# Patient Record
Sex: Male | Born: 1965 | Race: White | Hispanic: No | Marital: Single | Smoking: Current every day smoker
Health system: Southern US, Community
[De-identification: ages and names within clinical notes are randomized; demographics above are authoritative.]

---

## 2010-12-31 ENCOUNTER — Emergency Department (HOSPITAL_COMMUNITY)
Admission: EM | Admit: 2010-12-31 | Discharge: 2011-01-01 | Disposition: A | Discharge status: Home / Self Care | Payer: Self-pay | Attending: Emergency Medicine | Admitting: Emergency Medicine

## 2010-12-31 ENCOUNTER — Emergency Department (HOSPITAL_COMMUNITY): Payer: Self-pay

## 2010-12-31 DIAGNOSIS — S61209A Unspecified open wound of unspecified finger without damage to nail, initial encounter: Secondary | ICD-10-CM | POA: Insufficient documentation

## 2010-12-31 DIAGNOSIS — W3189XA Contact with other specified machinery, initial encounter: Secondary | ICD-10-CM | POA: Insufficient documentation

## 2010-12-31 LAB — DIFFERENTIAL
Basophils Absolute: 0 10*3/uL (ref 0.0–0.1)
Basophils Relative: 0 % (ref 0–1)
Lymphocytes Relative: 18 % (ref 12–46)
Monocytes Absolute: 0.8 10*3/uL (ref 0.1–1.0)
Neutro Abs: 6.7 10*3/uL (ref 1.7–7.7)
Neutrophils Relative %: 71 % (ref 43–77)

## 2010-12-31 LAB — CBC
HCT: 49.5 % (ref 39.0–52.0)
Hemoglobin: 17.7 g/dL — ABNORMAL HIGH (ref 13.0–17.0)
RBC: 5.43 MIL/uL (ref 4.22–5.81)

## 2011-01-03 NOTE — Op Note (Signed)
NAMEJEX, STRAUSBAUGH               ACCOUNT NO.:  [id number]  MEDICAL RECORD NO.:  [id number]  LOCATION:  [facility]                         FACILITY:  [facility]  PHYSICIAN:  Johnette Abraham, MD    DATE OF BIRTH:  November 30, 1965  DATE OF PROCEDURE:  12/31/2010 DATE OF DISCHARGE:  01/01/2011                              OPERATIVE REPORT   PREOPERATIVE DIAGNOSIS:  Chain saw injury and laceration to the left fourth and fifth digits.  POSTOPERATIVE DIAGNOSIS:  Chain saw injury and laceration to the left fourth and fifth digits.  PROCEDURE: 1. Exploration of wound of the left ring finger and left small finger. 2. Repair of extensor tendon central slip of the left ring finger and     closure of wounds of the left small finger and left ring finger.  INDICATIONS:  Mr. Kosmicki is a male who was using a chain saw to cut limbs this afternoon.  The chainsaw kicked backwards with the blade hitting his left ring and small finger.  He presented to the [facility].  He was evaluated and they felt that this was out of their expertise.  I was consulted and the patient was transferred to [facility] for definitive repair.  On evaluation, he had a gaping wounds overlying the PIP and proximal phalanx of the left ring finger and overlying the middle and distal phalanx and DIP of the left small finger.  He had inability to extend the ring finger with obvious extensor tendon laceration visible throughout the wound.  There was some skin loss on the laceration to the small finger.  Risks, benefits, and alternatives of surgery were discussed with the patient in detail.  He agreed with these risks and agreed to proceed with surgery for exploration and repair.  Consent was obtained.  PROCEDURE:  The patient was taken to the operating room and placed supine on the operating room table.  Preoperative antibiotics were given.  A time-out was performed.  The left upper extremity was prepped and draped in normal  sterile fashion.  The arm was elevated and exsanguinated.  The tourniquet was inflated to 250 mmHg.  The small finger was evaluated first.  Nonviable skin, full-thickness skin, subcutaneous tissue were sharply debrided and excised, exposing the extensor tendon apparatus.  There was a small nick of the extensor tendon; however, the majority of it was intact.  The skin was gently undermined to provide a tension-free closure and the lacerations were closed with multiple interrupted 5-0 nylon sutures.  Laceration closure approximate 2.5 cm.  Next, the ring finger was addressed.  The wound was enlarged and thoroughly irrigated.  The wound extended actually into the joint capsule of the PIP joint.  There was disruption of the extensor tendon and central slip mostly on the ulnar aspect of the finger.  There were small pieces of bone that were also in the wound.  This was thoroughly irrigated out.  Full-thickness skin, subcutaneous tissue, bone, and tendon were debrided.  The joint was irrigated as well. Following the tendon was brought into close approximation while the finger was held in extension with a figure-of-eight 4-0 FiberWire sutures given nice repair.  Afterwards, the skin  edges were closed with multiple interrupted 5-0 nylon sutures.  Wound closure here was approximately 2 cm.  Afterwards, the tourniquet was released.  The fingers returned to pink color.  Xeroform, a sterile dressing, and a ulnar gutter splint with the fingers and extension was applied.  The patient tolerated the procedure well and was taken to recovery room in stable condition.     Johnette Abraham, MD     HCC/MEDQ  D:  01/01/2011  T:  01/02/2011  Job:  [id number]  Electronically Signed by Knute Neu MD on 01/03/2011 11:23:57 AM

## 2012-01-12 ENCOUNTER — Encounter (HOSPITAL_COMMUNITY): Payer: Self-pay | Admitting: *Deleted

## 2012-01-12 ENCOUNTER — Emergency Department (HOSPITAL_COMMUNITY): Payer: Self-pay

## 2012-01-12 ENCOUNTER — Emergency Department (HOSPITAL_COMMUNITY)
Admission: EM | Admit: 2012-01-12 | Discharge: 2012-01-13 | Disposition: A | Discharge status: Home / Self Care | Payer: Self-pay | Attending: Emergency Medicine | Admitting: Emergency Medicine

## 2012-01-12 DIAGNOSIS — F17200 Nicotine dependence, unspecified, uncomplicated: Secondary | ICD-10-CM | POA: Insufficient documentation

## 2012-01-12 DIAGNOSIS — S93402A Sprain of unspecified ligament of left ankle, initial encounter: Secondary | ICD-10-CM

## 2012-01-12 DIAGNOSIS — S8392XA Sprain of unspecified site of left knee, initial encounter: Secondary | ICD-10-CM

## 2012-01-12 DIAGNOSIS — S93499A Sprain of other ligament of unspecified ankle, initial encounter: Secondary | ICD-10-CM | POA: Insufficient documentation

## 2012-01-12 DIAGNOSIS — W010XXA Fall on same level from slipping, tripping and stumbling without subsequent striking against object, initial encounter: Secondary | ICD-10-CM | POA: Insufficient documentation

## 2012-01-12 DIAGNOSIS — IMO0002 Reserved for concepts with insufficient information to code with codable children: Secondary | ICD-10-CM | POA: Insufficient documentation

## 2012-01-12 MED ORDER — OXYCODONE-ACETAMINOPHEN 5-325 MG PO TABS
2.0000 | ORAL_TABLET | Freq: Once | ORAL | Status: AC
  Administered 2012-01-12: 2 via ORAL
  Filled 2012-01-12: qty 2

## 2012-01-12 MED ORDER — IBUPROFEN 400 MG PO TABS
800.0000 mg | ORAL_TABLET | Freq: Once | ORAL | Status: AC
  Administered 2012-01-12: 800 mg via ORAL
  Filled 2012-01-12: qty 2

## 2012-01-12 NOTE — ED Notes (Addendum)
Patient injured his left knee and foot today and the pool.  He twisted his left leg from the knee down and the pain is getting worse.

## 2012-01-13 MED ORDER — HYDROCODONE-ACETAMINOPHEN 5-500 MG PO TABS: 1.0000 | ORAL_TABLET | Freq: Four times a day (QID) | ORAL | Status: AC | PRN

## 2012-01-13 MED ORDER — IBUPROFEN 800 MG PO TABS: 800.0000 mg | ORAL_TABLET | Freq: Three times a day (TID) | ORAL | Status: AC

## 2012-01-13 NOTE — ED Notes (Signed)
Pt discharged home with friend.

## 2012-01-13 NOTE — ED Provider Notes (Signed)
History     CSN: [id number]  Arrival date & time 01/12/12  2129   First MD Initiated Contact with Patient 01/12/12 2258      Chief Complaint  Patient presents with  . Knee Injury    (Consider location/radiation/quality/duration/timing/severity/associated sxs/prior treatment) HPI  Patient presents to ER complaining of left foot/ankle and knee injury a few hours PTA stating he was carrying a heavy bucket of chemical around the side of a pool and slipped causing his left knee, ankle and foot to twist stating "my leg stayed in one place and the rest of my body twisted the other." Patient is complaining of greatest pain in foot and ankle complaining of associated bruising and swelling. Mild pain in left knee. Denies swelling of left knee. Took nothing for pain PTA. States he is unable to bear weight due to pain. Patient has seen Dr. Priscille Kluver with ortho in past for other ortho complaints. Denies extremity numbness or tingling. Denies back pain or neck pain. Pain aggravated by movement and weight bearing. Improved with lying still.    History reviewed. No pertinent past medical history.  No past surgical history on file.  History reviewed. No pertinent family history.  History  Substance Use Topics  . Smoking status: Current Everyday Smoker    Types: Cigarettes  . Smokeless tobacco: Not on file  . Alcohol Use: Yes      Review of Systems  All other systems reviewed and are negative.    Allergies  Review of patient's allergies indicates no known allergies.  Home Medications  No current outpatient prescriptions on file.  BP 116/67  Pulse 84  Temp 97.6 F (36.4 C) (Oral)  Resp 16  SpO2 96%  Physical Exam  Nursing note and vitals reviewed. Constitutional: He is oriented to person, place, and time. He appears well-developed.  HENT:  Head: Normocephalic and atraumatic.  Eyes: Conjunctivae are normal.  Neck: Normal range of motion. Neck supple.  Cardiovascular: Normal  rate.   Pulmonary/Chest: Effort normal.  Musculoskeletal: He exhibits edema and tenderness.       Mild TTP of entire knee but FROM without laxity of ant/post/med/lateral stress. Negative bollotment. mild Pain with FROM but no crepitous. No heat or erythema of knee. FROM of bilateral hips without pain.   Swelling and TTP of left lateral ankle with TTP and swelling into fore foot but no TTP of calf. No break in skin. Good pedal pulse and cap refill of all toes. Wiggling toes without difficulty.   Neurological: He is alert and oriented to person, place, and time.  Skin: Skin is warm and dry. No rash noted. No erythema. No pallor.    ED Course  Procedures (including critical care time)  PO Percocet and ibuprofen  Labs Reviewed - No data to display Dg Ankle Complete Left  01/13/2012  *RADIOLOGY REPORT*  Clinical Data: Left ankle pain after fall.  LEFT ANKLE COMPLETE - 3+ VIEW  Comparison: None.  Findings: No fracture or dislocation is noted.  Joint spaces are intact.  IMPRESSION: Normal left ankle.  Original Report Authenticated By: Venita Sheffield., M.D.   Dg Foot Complete Left  01/13/2012  *RADIOLOGY REPORT*  Clinical Data: Left foot pain after fall.  LEFT FOOT - COMPLETE 3+ VIEW  Comparison: None.  Findings: No fracture or dislocation is noted.  Joint spaces are intact.  Small bone spur is seen arising from posterior calcaneus.  IMPRESSION: No acute abnormality involving left foot.  Original Report Authenticated By:  Venita Sheffield., M.D.     1. Sprain of left ankle   2. Sprain of left knee       MDM  No acute findings on xray with LLE neuro vasc intact. Likely sprain of left foot/ankle and knee. Patient has established relationship with ortho to follow up for recheck of ongoing pain. Will brace and give crutches for comfort.         [place], [place] 01/13/12 [phone]

## 2012-01-13 NOTE — ED Provider Notes (Signed)
Medical screening examination/treatment/procedure(s) were performed by non-physician practitioner and as supervising physician I was immediately available for consultation/collaboration.   Carleene Cooper III, MD 01/13/12 [phone]

## 2012-01-13 NOTE — ED Notes (Signed)
Ortho paged. 

## 2013-08-11 IMAGING — CR DG FOOT COMPLETE 3+V*L*
3 series · 3 of 3 positions shown · non-contrast
Comparison: None.

CLINICAL DATA: Left foot pain after fall.

LEFT FOOT - COMPLETE 3+ VIEW

[x foot lat left]
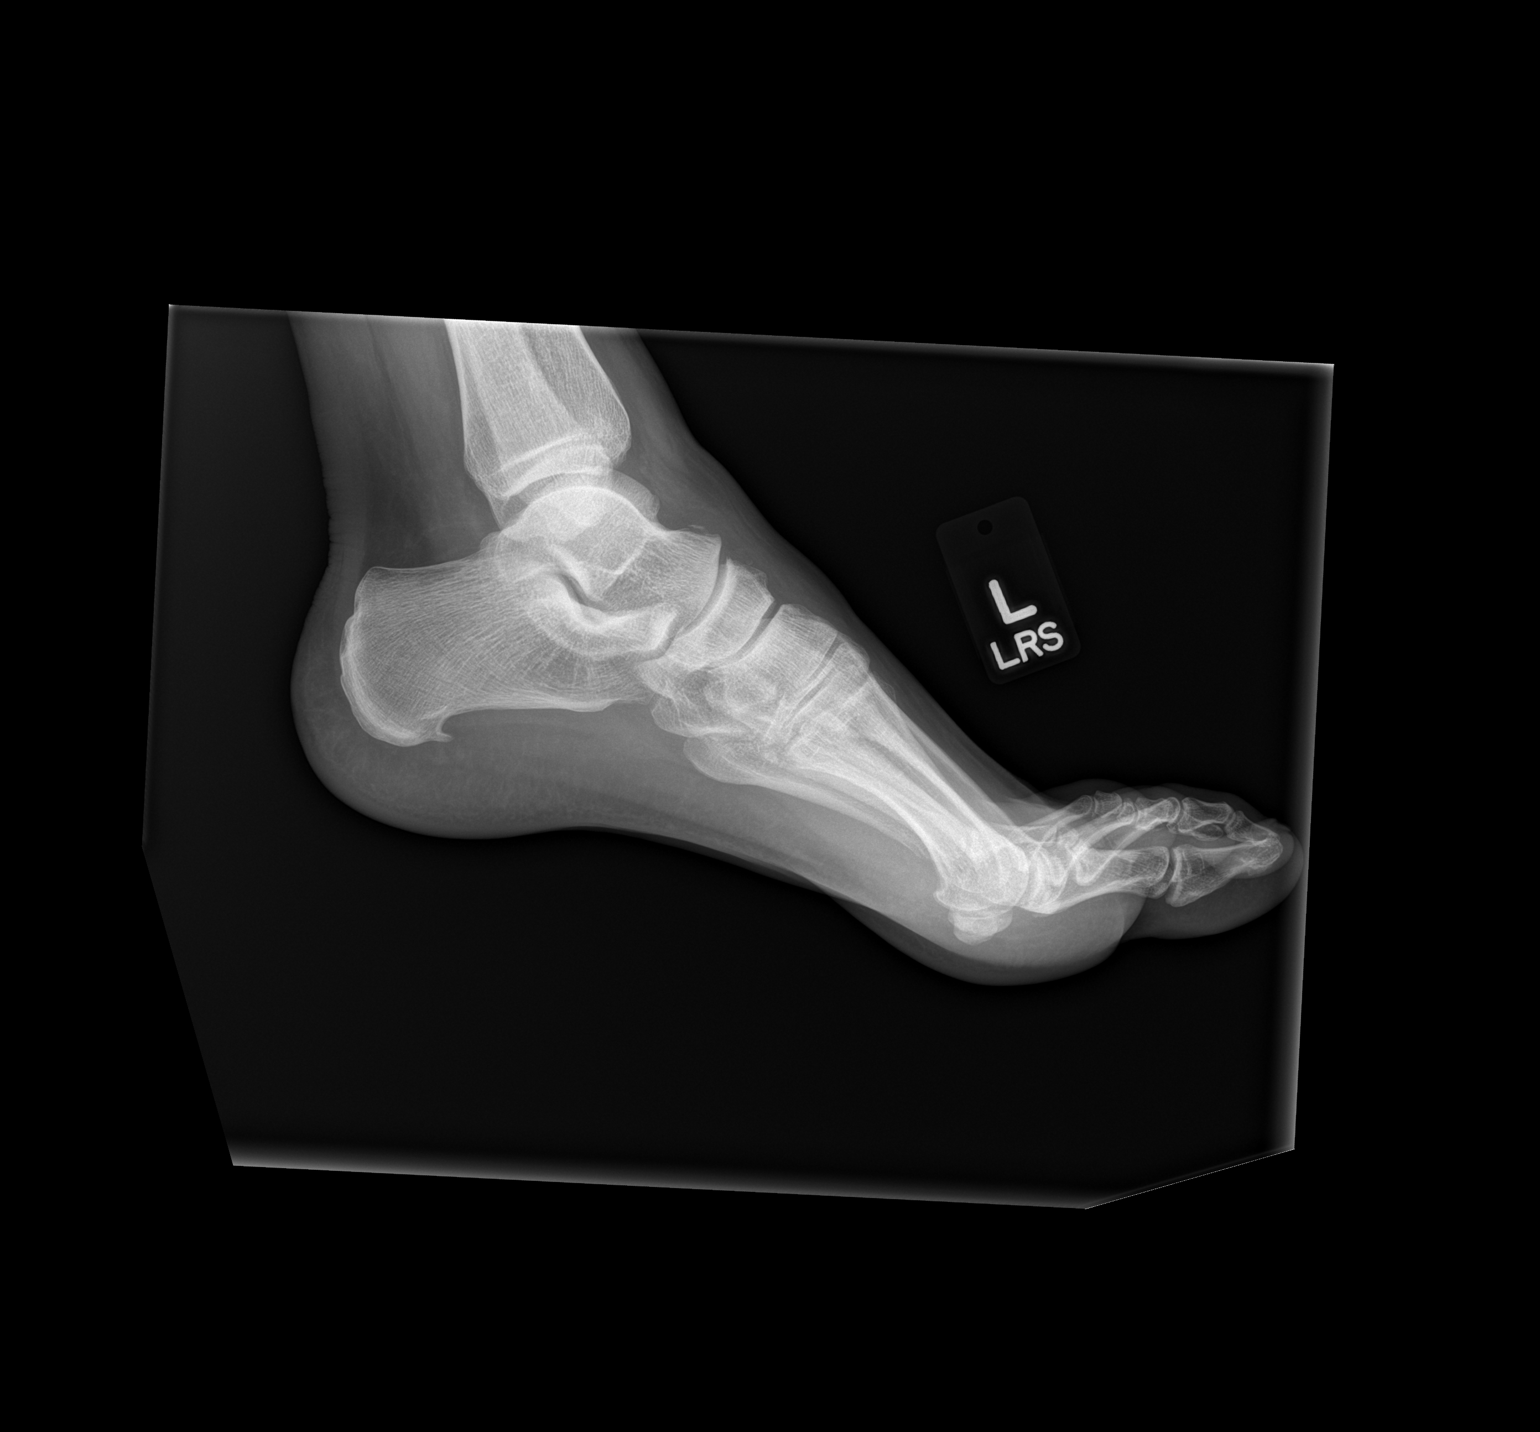

[x foot ap left]
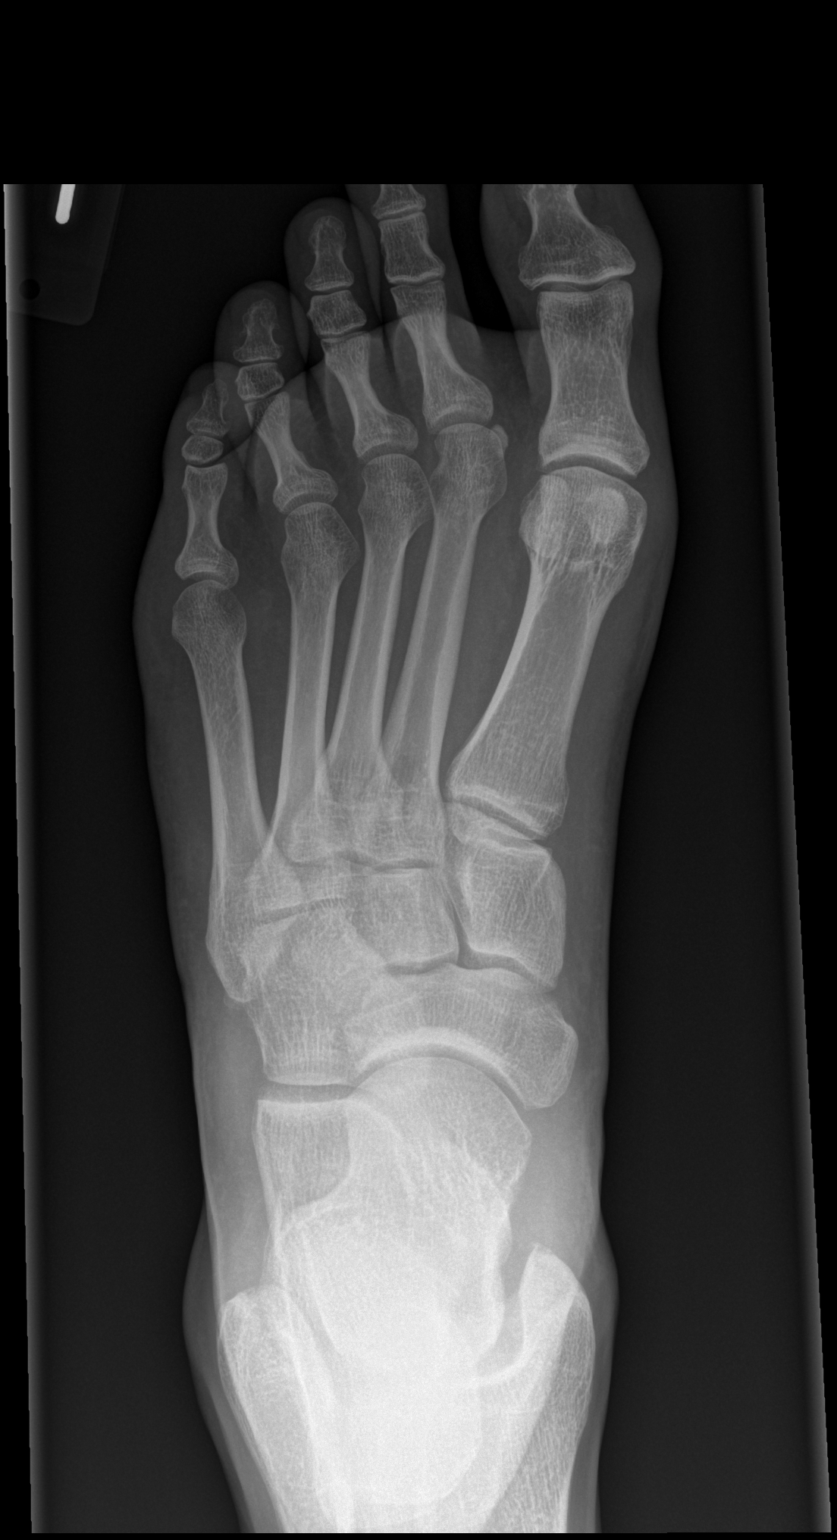

[x foot obl left]
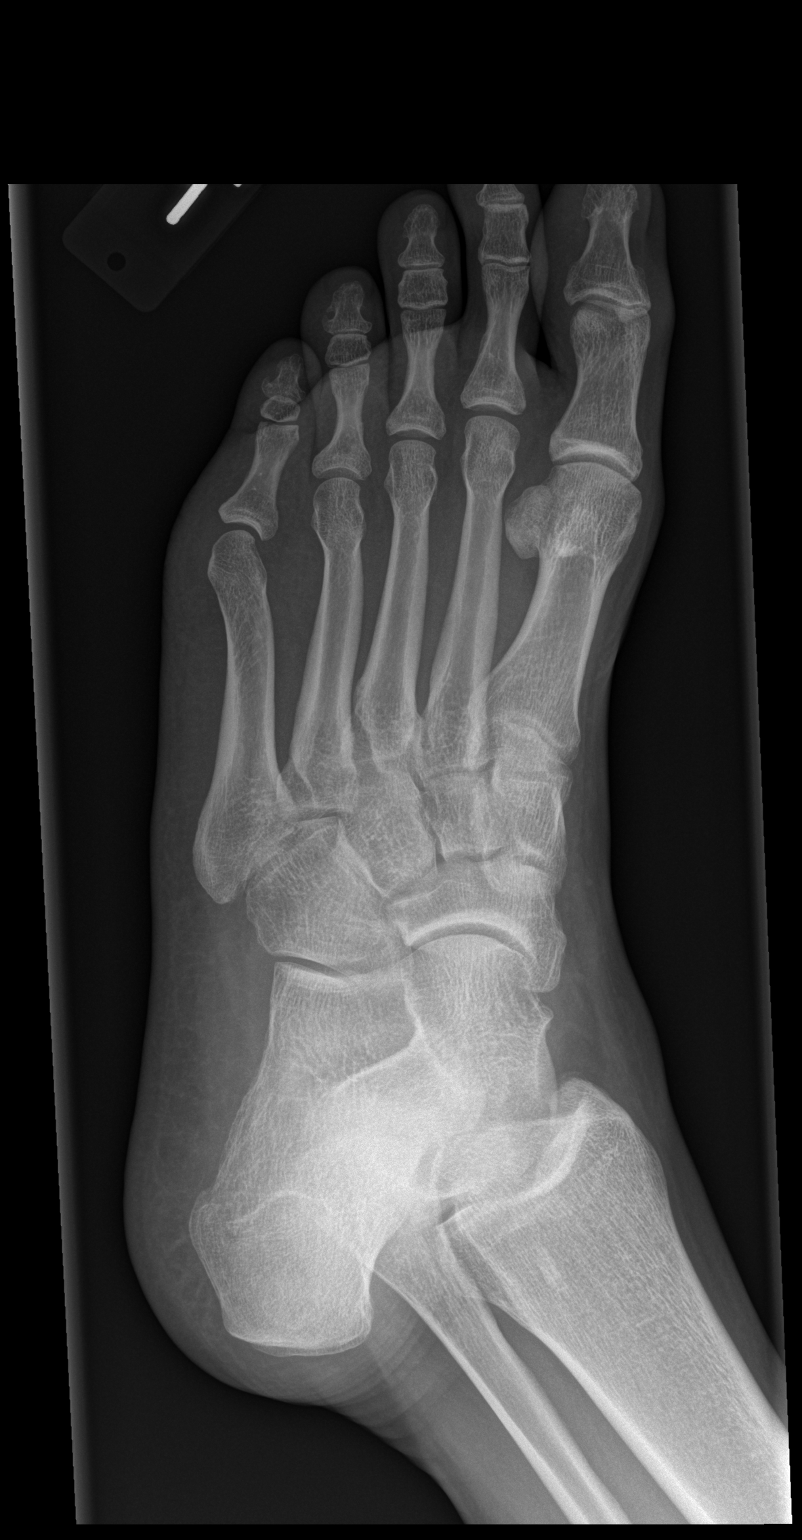

[3 of 3 positions shown; findings below may reference images not displayed]

FINDINGS: No fracture or dislocation is noted.  Joint spaces are
intact.  Small bone spur is seen arising from posterior calcaneus.
IMPRESSION: No acute abnormality involving left foot.

## 2013-08-11 IMAGING — CR DG ANKLE COMPLETE 3+V*L*
3 series · 3 of 3 positions shown · non-contrast
Comparison: None.

CLINICAL DATA: Left ankle pain after fall.

LEFT ANKLE COMPLETE - 3+ VIEW

[x ankle ap left]
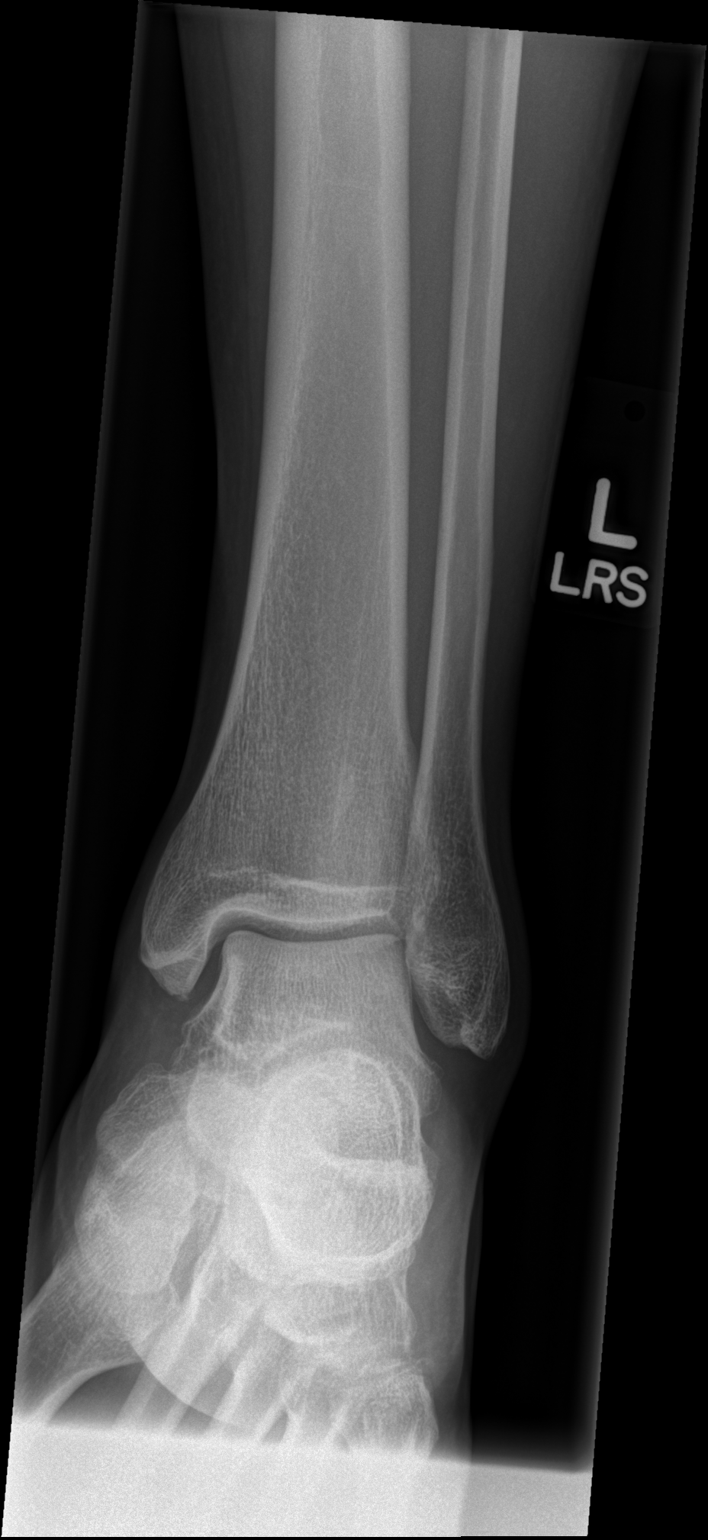

[x ankle obl left]
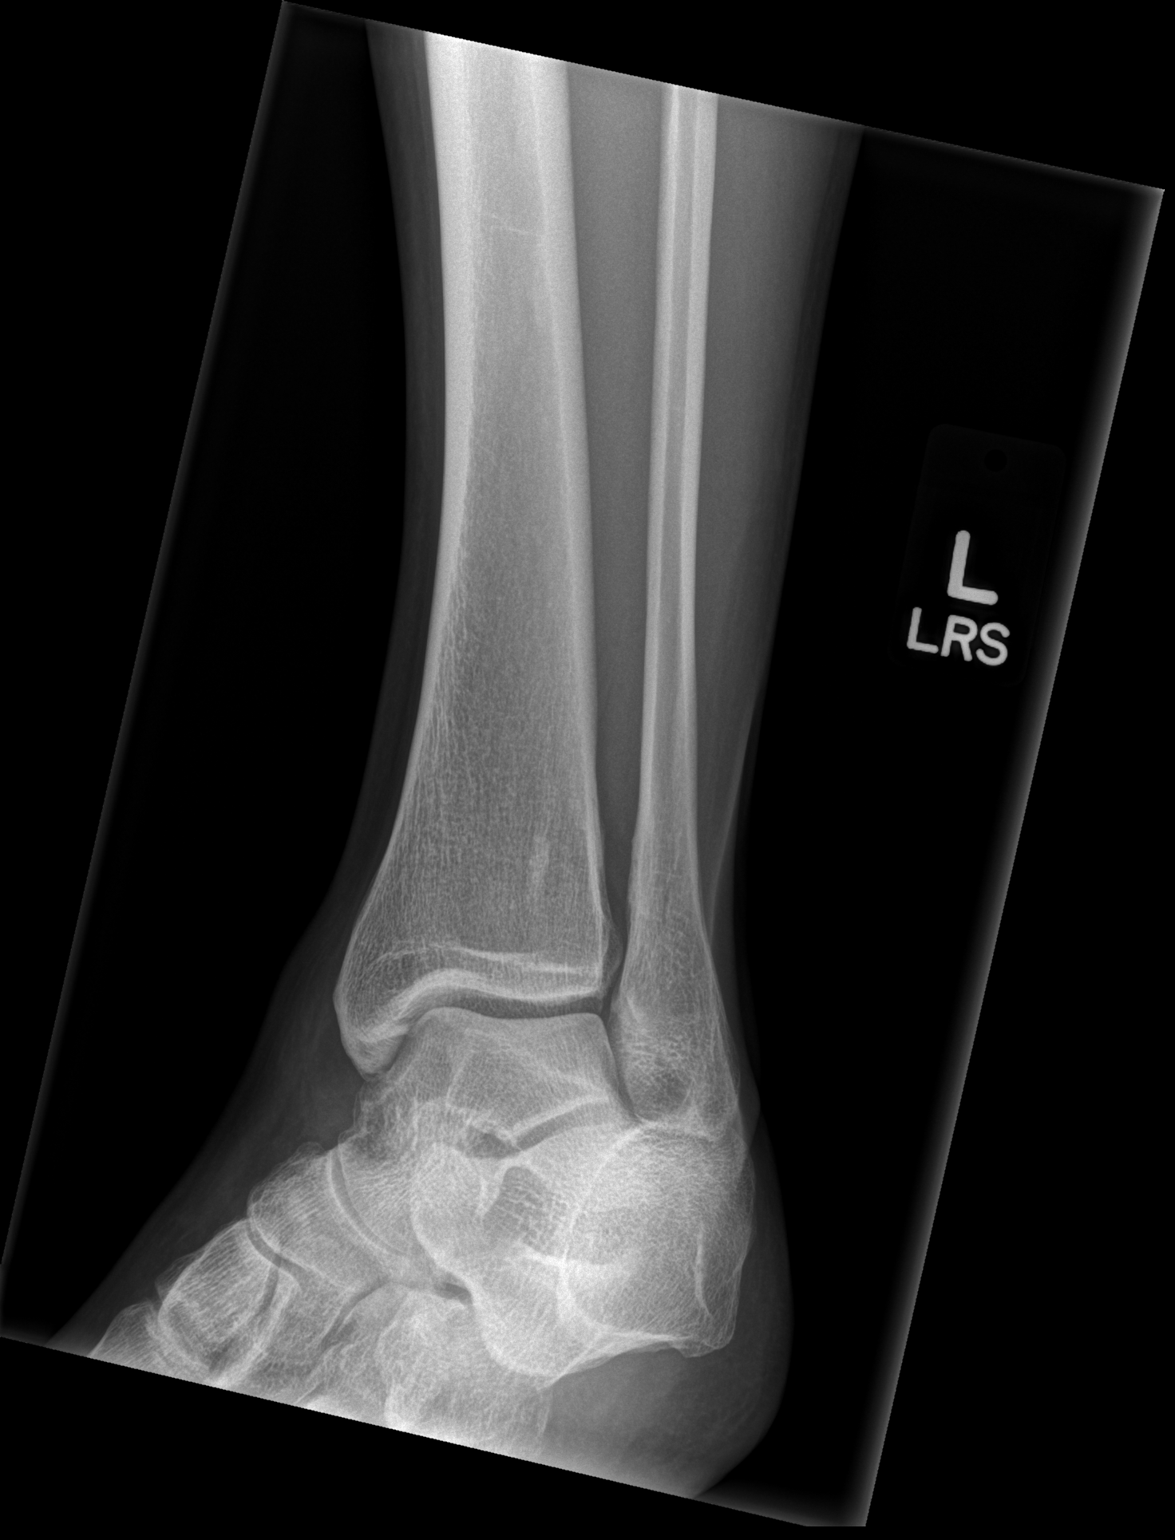

[x ankle lat left]
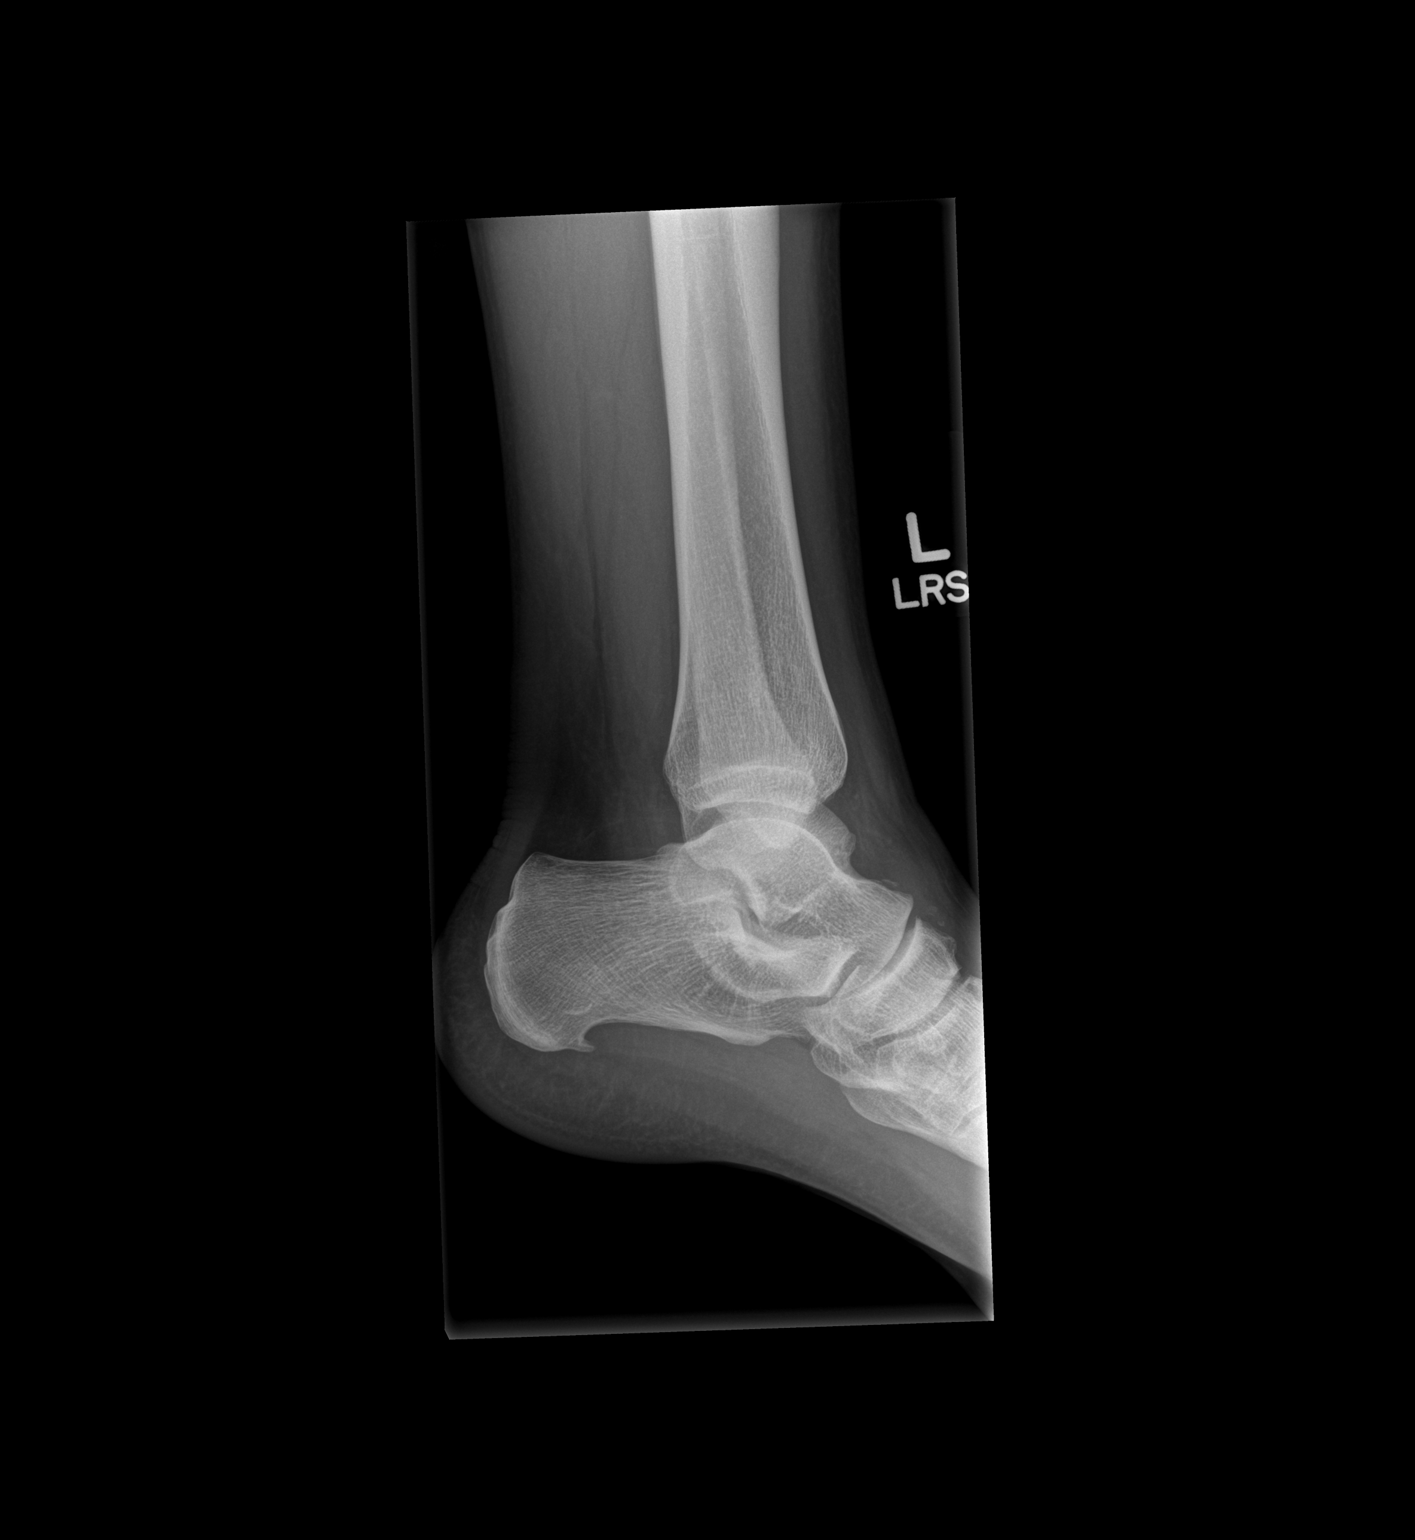

[3 of 3 positions shown; findings below may reference images not displayed]

FINDINGS: No fracture or dislocation is noted.  Joint spaces are
intact.
IMPRESSION: Normal left ankle.

## 2013-10-28 ENCOUNTER — Emergency Department (HOSPITAL_COMMUNITY)
Admission: EM | Admit: 2013-10-28 | Discharge: 2013-10-28 | Disposition: A | Discharge status: Home / Self Care | Payer: No Typology Code available for payment source | Source: Home / Self Care | Attending: Family Medicine | Admitting: Family Medicine

## 2013-10-28 ENCOUNTER — Encounter (HOSPITAL_COMMUNITY): Payer: Self-pay | Admitting: Emergency Medicine

## 2013-10-28 DIAGNOSIS — J302 Other seasonal allergic rhinitis: Secondary | ICD-10-CM

## 2013-10-28 DIAGNOSIS — J309 Allergic rhinitis, unspecified: Secondary | ICD-10-CM

## 2013-10-28 LAB — POCT RAPID STREP A: Streptococcus, Group A Screen (Direct): NEGATIVE

## 2013-10-28 MED ORDER — IPRATROPIUM BROMIDE 0.06 % NA SOLN: 2.0000 | Freq: Four times a day (QID) | NASAL | Status: DC

## 2013-10-28 MED ORDER — CETIRIZINE HCL 10 MG PO TABS: 10.0000 mg | ORAL_TABLET | Freq: Every day | ORAL | Status: DC

## 2013-10-28 NOTE — ED Provider Notes (Signed)
CSN: [id number]     Arrival date & time 10/28/13  1212 History   First MD Initiated Contact with Patient 10/28/13 1240     Chief Complaint  Patient presents with  . Sore Throat   (Consider location/radiation/quality/duration/timing/severity/associated sxs/prior Treatment) Patient is a 48 y.o. male presenting with pharyngitis. The history is provided by the patient.  Sore Throat This is a new problem. The current episode started more than 1 week ago (2 weeks of sx of st, today girlfriend dx'd with strep.). The problem has been gradually worsening.    History reviewed. No pertinent past medical history. History reviewed. No pertinent past surgical history. No family history on file. History  Substance Use Topics  . Smoking status: Current Every Day Smoker    Types: Cigarettes  . Smokeless tobacco: Not on file  . Alcohol Use: Yes    Review of Systems  Constitutional: Negative.  Negative for fever.  HENT: Positive for congestion, rhinorrhea and sore throat.   Respiratory: Positive for cough.   Hematological: Positive for adenopathy.    Allergies  Review of patient's allergies indicates no known allergies.  Home Medications   Prior to Admission medications   Not on File   BP 156/83  Pulse 89  Temp(Src) 97.2 F (36.2 C) (Oral)  Resp 18  SpO2 97% Physical Exam  Nursing note and vitals reviewed. Constitutional: He is oriented to person, place, and time. He appears well-developed and well-nourished.  HENT:  Head: Normocephalic.  Right Ear: External ear normal.  Left Ear: External ear normal.  Mouth/Throat: Uvula is midline and mucous membranes are normal. Posterior oropharyngeal erythema present. No oropharyngeal exudate or tonsillar abscesses.  Neck: Normal range of motion. Neck supple.  Lymphadenopathy:    He has cervical adenopathy.  Neurological: He is alert and oriented to person, place, and time.  Skin: Skin is warm and dry.    ED Course  Procedures  (including critical care time) Labs Review Labs Reviewed  POCT RAPID STREP A ([facility] ONLY)   Strep neg. Imaging Review No results found.   MDM   1. Seasonal allergic rhinitis        Linna HoffJames D Edsel Shives, MD 10/28/13 1426

## 2013-10-28 NOTE — Discharge Instructions (Signed)
Drink plenty of fluids as discussed, use medicine as prescribed, and mucinex or delsym for cough. Return or see your doctor if further problems °

## 2013-10-28 NOTE — ED Notes (Signed)
Sore throat for 2 weeks, reports girlfriend diagnosed with strep this morning

## 2013-10-30 LAB — CULTURE, GROUP A STREP

## 2021-05-24 ENCOUNTER — Inpatient Hospital Stay (HOSPITAL_COMMUNITY)
Admission: EM | Admit: 2021-05-24 | Discharge: 2021-05-26 | DRG: 639 | Disposition: A | Discharge status: Home / Self Care | Payer: No Typology Code available for payment source | Attending: Internal Medicine | Admitting: Internal Medicine

## 2021-05-24 ENCOUNTER — Other Ambulatory Visit: Payer: Self-pay

## 2021-05-24 ENCOUNTER — Encounter (HOSPITAL_COMMUNITY): Payer: Self-pay | Admitting: Emergency Medicine

## 2021-05-24 ENCOUNTER — Emergency Department (HOSPITAL_COMMUNITY): Payer: No Typology Code available for payment source

## 2021-05-24 DIAGNOSIS — Z20822 Contact with and (suspected) exposure to covid-19: Secondary | ICD-10-CM | POA: Diagnosis present

## 2021-05-24 DIAGNOSIS — Z720 Tobacco use: Secondary | ICD-10-CM | POA: Diagnosis present

## 2021-05-24 DIAGNOSIS — F1090 Alcohol use, unspecified, uncomplicated: Secondary | ICD-10-CM | POA: Diagnosis present

## 2021-05-24 DIAGNOSIS — Z8249 Family history of ischemic heart disease and other diseases of the circulatory system: Secondary | ICD-10-CM

## 2021-05-24 DIAGNOSIS — Z597 Insufficient social insurance and welfare support: Secondary | ICD-10-CM

## 2021-05-24 DIAGNOSIS — Z789 Other specified health status: Secondary | ICD-10-CM | POA: Diagnosis present

## 2021-05-24 DIAGNOSIS — Z833 Family history of diabetes mellitus: Secondary | ICD-10-CM

## 2021-05-24 DIAGNOSIS — E876 Hypokalemia: Secondary | ICD-10-CM | POA: Diagnosis present

## 2021-05-24 DIAGNOSIS — E1110 Type 2 diabetes mellitus with ketoacidosis without coma: Principal | ICD-10-CM

## 2021-05-24 DIAGNOSIS — F17210 Nicotine dependence, cigarettes, uncomplicated: Secondary | ICD-10-CM | POA: Diagnosis present

## 2021-05-24 LAB — CBC WITH DIFFERENTIAL/PLATELET
Abs Immature Granulocytes: 0.05 10*3/uL (ref 0.00–0.07)
Basophils Absolute: 0 10*3/uL (ref 0.0–0.1)
Basophils Relative: 0 %
Eosinophils Absolute: 0 10*3/uL (ref 0.0–0.5)
Eosinophils Relative: 0 %
HCT: 49.6 % (ref 39.0–52.0)
Hemoglobin: 17 g/dL (ref 13.0–17.0)
Immature Granulocytes: 1 %
Lymphocytes Relative: 11 %
Lymphs Abs: 1.2 10*3/uL (ref 0.7–4.0)
MCH: 32.1 pg (ref 26.0–34.0)
MCHC: 34.3 g/dL (ref 30.0–36.0)
MCV: 93.6 fL (ref 80.0–100.0)
Monocytes Absolute: 0.6 10*3/uL (ref 0.1–1.0)
Monocytes Relative: 6 %
Neutro Abs: 9.1 10*3/uL — ABNORMAL HIGH (ref 1.7–7.7)
Neutrophils Relative %: 82 %
Platelets: 242 10*3/uL (ref 150–400)
RBC: 5.3 MIL/uL (ref 4.22–5.81)
RDW: 12.2 % (ref 11.5–15.5)
WBC: 11 10*3/uL — ABNORMAL HIGH (ref 4.0–10.5)
nRBC: 0 % (ref 0.0–0.2)

## 2021-05-24 LAB — BLOOD GAS, VENOUS
Acid-base deficit: 8.4 mmol/L — ABNORMAL HIGH (ref 0.0–2.0)
Bicarbonate: 17.4 mmol/L — ABNORMAL LOW (ref 20.0–28.0)
O2 Saturation: 95 %
Patient temperature: 98.6
pCO2, Ven: 37.6 mmHg — ABNORMAL LOW (ref 44.0–60.0)
pH, Ven: 7.286 (ref 7.250–7.430)
pO2, Ven: 84.4 mmHg — ABNORMAL HIGH (ref 32.0–45.0)

## 2021-05-24 LAB — URINALYSIS, ROUTINE W REFLEX MICROSCOPIC
Bilirubin Urine: NEGATIVE
Glucose, UA: >=500 mg/dL — AB
Hgb urine dipstick: NEGATIVE
Ketones, ur: 15 mg/dL — AB
Leukocytes,Ua: NEGATIVE
Nitrite: NEGATIVE
Protein, ur: NEGATIVE mg/dL
Specific Gravity, Urine: <1.005 — ABNORMAL LOW (ref 1.005–1.030)
pH: 5.5 (ref 5.0–8.0)

## 2021-05-24 LAB — I-STAT CHEM 8, ED
BUN: 17 mg/dL (ref 6–20)
Calcium, Ion: 1.07 mmol/L — ABNORMAL LOW (ref 1.15–1.40)
Chloride: 96 mmol/L — ABNORMAL LOW (ref 98–111)
Creatinine, Ser: 1.3 mg/dL — ABNORMAL HIGH (ref 0.61–1.24)
Glucose, Bld: >700 mg/dL (ref 70–99)
HCT: 52 % (ref 39.0–52.0)
Hemoglobin: 17.7 g/dL — ABNORMAL HIGH (ref 13.0–17.0)
Potassium: 5 mmol/L (ref 3.5–5.1)
Sodium: 130 mmol/L — ABNORMAL LOW (ref 135–145)
TCO2: 20 mmol/L — ABNORMAL LOW (ref 22–32)

## 2021-05-24 LAB — CBG MONITORING, ED
Glucose-Capillary: >600 mg/dL (ref 70–99)
Glucose-Capillary: 597 mg/dL (ref 70–99)
Glucose-Capillary: 457 mg/dL — ABNORMAL HIGH (ref 70–99)
Glucose-Capillary: 275 mg/dL — ABNORMAL HIGH (ref 70–99)
Glucose-Capillary: 207 mg/dL — ABNORMAL HIGH (ref 70–99)

## 2021-05-24 LAB — RAPID URINE DRUG SCREEN, HOSP PERFORMED
Amphetamines: NOT DETECTED
Barbiturates: NOT DETECTED
Benzodiazepines: NOT DETECTED
Cocaine: NOT DETECTED
Opiates: NOT DETECTED
Tetrahydrocannabinol: NOT DETECTED

## 2021-05-24 LAB — COMPREHENSIVE METABOLIC PANEL
ALT: 47 U/L — ABNORMAL HIGH (ref 0–44)
AST: 30 U/L (ref 15–41)
Albumin: 4.5 g/dL (ref 3.5–5.0)
Alkaline Phosphatase: 91 U/L (ref 38–126)
Anion gap: 19 — ABNORMAL HIGH (ref 5–15)
BUN: 16 mg/dL (ref 6–20)
CO2: 15 mmol/L — ABNORMAL LOW (ref 22–32)
Calcium: 8.9 mg/dL (ref 8.9–10.3)
Chloride: 93 mmol/L — ABNORMAL LOW (ref 98–111)
Creatinine, Ser: 1.19 mg/dL (ref 0.61–1.24)
GFR, Estimated: >60 mL/min (ref >60)
Glucose, Bld: 869 mg/dL (ref 70–99)
Potassium: 5.1 mmol/L (ref 3.5–5.1)
Sodium: 127 mmol/L — ABNORMAL LOW (ref 135–145)
Total Bilirubin: 1.3 mg/dL — ABNORMAL HIGH (ref 0.3–1.2)
Total Protein: 7.7 g/dL (ref 6.5–8.1)

## 2021-05-24 LAB — BETA-HYDROXYBUTYRIC ACID: Beta-Hydroxybutyric Acid: 1.35 mmol/L — ABNORMAL HIGH (ref 0.05–0.27)

## 2021-05-24 LAB — ETHANOL: Alcohol, Ethyl (B): 108 mg/dL — ABNORMAL HIGH (ref <10)

## 2021-05-24 LAB — RESP PANEL BY RT-PCR (FLU A&B, COVID) ARPGX2
Influenza A by PCR: NEGATIVE
Influenza B by PCR: NEGATIVE
SARS Coronavirus 2 by RT PCR: NEGATIVE

## 2021-05-24 LAB — URINALYSIS, MICROSCOPIC (REFLEX)
Bacteria, UA: NONE SEEN
RBC / HPF: NONE SEEN RBC/hpf (ref 0–5)

## 2021-05-24 MED ORDER — INSULIN REGULAR(HUMAN) IN NACL 100-0.9 UT/100ML-% IV SOLN
INTRAVENOUS | Status: DC
  Administered 2021-05-24: 15 [IU]/h via INTRAVENOUS
  Filled 2021-05-24: qty 100

## 2021-05-24 MED ORDER — DEXTROSE 50 % IV SOLN: 0.0000 mL | INTRAVENOUS | Status: DC | PRN

## 2021-05-24 MED ORDER — LORAZEPAM 2 MG/ML IJ SOLN
1.0000 mg | INTRAMUSCULAR | Status: DC | PRN
  Administered 2021-05-24 – 2021-05-25 (×2): 1 mg via INTRAVENOUS
  Administered 2021-05-25: 2 mg via INTRAVENOUS
  Filled 2021-05-24 (×3): qty 1

## 2021-05-24 MED ORDER — LORAZEPAM 1 MG PO TABS: 1.0000 mg | ORAL_TABLET | ORAL | Status: DC | PRN

## 2021-05-24 MED ORDER — THIAMINE HCL 100 MG PO TABS
100.0000 mg | ORAL_TABLET | Freq: Every day | ORAL | Status: DC
  Administered 2021-05-25 – 2021-05-26 (×2): 100 mg via ORAL
  Filled 2021-05-24 (×2): qty 1

## 2021-05-24 MED ORDER — THIAMINE HCL 100 MG/ML IJ SOLN
100.0000 mg | Freq: Every day | INTRAMUSCULAR | Status: DC
  Administered 2021-05-24: 100 mg via INTRAVENOUS
  Filled 2021-05-24: qty 2

## 2021-05-24 MED ORDER — NICOTINE 21 MG/24HR TD PT24
21.0000 mg | MEDICATED_PATCH | Freq: Every day | TRANSDERMAL | Status: DC
  Administered 2021-05-24 – 2021-05-26 (×3): 21 mg via TRANSDERMAL
  Filled 2021-05-24 (×3): qty 1

## 2021-05-24 MED ORDER — LACTATED RINGERS IV SOLN: INTRAVENOUS | Status: DC

## 2021-05-24 MED ORDER — INSULIN REGULAR(HUMAN) IN NACL 100-0.9 UT/100ML-% IV SOLN: INTRAVENOUS | Status: DC

## 2021-05-24 MED ORDER — LACTATED RINGERS IV BOLUS
20.0000 mL/kg | Freq: Once | INTRAVENOUS | Status: AC
  Administered 2021-05-24: 2086 mL via INTRAVENOUS

## 2021-05-24 MED ORDER — ACETAMINOPHEN 325 MG PO TABS
650.0000 mg | ORAL_TABLET | Freq: Four times a day (QID) | ORAL | Status: DC | PRN
  Administered 2021-05-24 – 2021-05-25 (×2): 650 mg via ORAL
  Filled 2021-05-24 (×2): qty 2

## 2021-05-24 MED ORDER — DEXTROSE IN LACTATED RINGERS 5 % IV SOLN: INTRAVENOUS | Status: DC

## 2021-05-24 MED ORDER — ENOXAPARIN SODIUM 40 MG/0.4ML IJ SOSY
40.0000 mg | PREFILLED_SYRINGE | INTRAMUSCULAR | Status: DC
  Administered 2021-05-24 – 2021-05-25 (×2): 40 mg via SUBCUTANEOUS
  Filled 2021-05-24 (×2): qty 0.4

## 2021-05-24 MED ORDER — FOLIC ACID 1 MG PO TABS
1.0000 mg | ORAL_TABLET | Freq: Every day | ORAL | Status: DC
  Administered 2021-05-24 – 2021-05-26 (×3): 1 mg via ORAL
  Filled 2021-05-24 (×3): qty 1

## 2021-05-24 MED ORDER — ADULT MULTIVITAMIN W/MINERALS CH
1.0000 | ORAL_TABLET | Freq: Every day | ORAL | Status: DC
  Administered 2021-05-24 – 2021-05-26 (×3): 1 via ORAL
  Filled 2021-05-24 (×3): qty 1

## 2021-05-24 NOTE — ED Triage Notes (Signed)
Patient BIBA from home c/o generalized weakness x1 month, increased thirst and fatigue x1 week. Reports checking blood sugar at home and reading >500. Pt does not report hx of diabetes. Hx heavy alcohol and tobacco use. Pt reports he does not have a PCP.   BP 112/70 HR 80 NSR RR 18 SpO2 98% RA CBG HIGH   20 G LAC  600 cc NS

## 2021-05-24 NOTE — H&P (Signed)
History and Physical    Sakai Wolford PZW:[id number] DOB: 01-27-1966 DOA: 05/24/2021  PCP: Pcp, No  Patient coming from: Home  I have personally briefly reviewed patient's old medical records in [facility]  Chief Complaint: Weakness, increased thirst, elevated blood sugar  HPI: Korben Carcione is a 55 y.o. male with medical history significant for alcohol and tobacco use who presented to the ED for evaluation of increased thirst, fatigue, and elevated blood sugars.  Patient reports developing URI symptoms 3 weeks ago.  Began to feel better however over the last week he has been feeling very fatigued with significant polyuria and polydipsia.  He has been drinking a lot of fluids consisting of fruit juices, sodas, and sports drinks.  He reports some recent burning with urination.  He has also had some loose stools.  He began to feel nauseous last night and had 1 episode of nonbloody nonbilious emesis.  He has had some lower abdominal pain.  He reports continued frequent nonproductive cough which is unchanged from baseline.  He has not had any dyspnea.  He is a chronic smoker of at least 1 pack/day for at least 25 years.  He reports chronic daily alcohol use, usually a sixpack of beer per day.  Also drinks occasional liquor and wine.  He does admit to recent cocaine use, last use few days prior to admission.  He reports occasional marijuana use.  He denies any IV drug use.  He denies any known drug allergies.  He is not aware of any chronic medical conditions.  He denies any prior known history of diabetes.  He states that his sister has diabetes and his mother has hypertension.  He does not have routine medical care and has not seen a doctor for years.  ED Course:  Initial vitals showed BP 103/79, pulse 101, RR 20, temp 98.5 F, SPO2 96% on room air.  Labs show serum glucose 869, sodium 127 (145 when corrected for hyperglycemia), bicarb 15, potassium 5.1, anion gap 19, BUN 16,  creatinine 1.19, beta hydroxybutyrate 1.35, WBC 11.0, hemoglobin 17.0, platelets 242,000.  VBG showed pH 7.286, PCO2 37.6, PO2 84.4.  Serum ethanol 108.  Respiratory panel and urinalysis ordered and pending.  Portable chest x-ray is negative for focal consolidation, edema, or effusion.  Patient was started on insulin infusion and IV fluids.  The hospitalist service was consulted to admit for further evaluation and management.  Review of Systems: All systems reviewed and are negative except as documented in history of present illness above.   History reviewed. No pertinent past medical history.  History reviewed. No pertinent surgical history.  Social History:  reports that he has been smoking cigarettes. He has a 25.00 pack-year smoking history. He does not have any smokeless tobacco history on file. He reports current alcohol use. He reports current drug use. Drugs: Cocaine and Marijuana.  No Known Allergies  Family History  Problem Relation Age of Onset   Hypertension Mother    Diabetes Sister      Prior to Admission medications   Medication Sig Start Date End Date Taking? Authorizing Provider  acetaminophen (TYLENOL) 500 MG tablet Take 500-1,000 mg by mouth every 6 (six) hours as needed for mild pain or headache.   Yes [provider]  ibuprofen (ADVIL) 200 MG tablet Take 200-400 mg by mouth every 6 (six) hours as needed for headache or mild pain.   Yes [provider]  multivitamin (ONE-A-DAY MEN'S) TABS tablet Take 1  tablet by mouth daily with breakfast.   Yes [provider]  vitamin C (ASCORBIC ACID) 500 MG tablet Take 500-1,000 mg by mouth daily.   Yes [provider]  cetirizine (ZYRTEC) 10 MG tablet Take 1 tablet (10 mg total) by mouth daily. One tab daily for allergies Patient not taking: Reported on 05/24/2021 10/28/13   Linna Hoff, MD  ipratropium (ATROVENT) 0.06 % nasal spray Place 2 sprays into both nostrils 4 (four) times  daily. Patient not taking: Reported on 05/24/2021 10/28/13   Linna Hoff, MD    Physical Exam: Vitals:   05/24/21 1808 05/24/21 1921 05/24/21 2001  BP: 103/79    Pulse: 99    Resp: 20    Temp: 98.5 F (36.9 C)    TempSrc: Oral    SpO2: 96%    Weight:  104.3 kg   Height:   6' (1.829 m)   Constitutional: Resting in bed, NAD, calm, comfortable Eyes: PERRL, lids and conjunctivae normal ENMT: Mucous membranes are dry. Posterior pharynx clear of any exudate or lesions.Normal dentition.  Neck: normal, supple, no masses. Respiratory: clear to auscultation bilaterally, no wheezing, no crackles. Normal respiratory effort. No accessory muscle use.  Cardiovascular: Regular rate and rhythm, no murmurs / rubs / gallops. No extremity edema. 2+ pedal pulses. Abdomen: Soft umbilical hernia present.  No tenderness, no masses palpated. No hepatosplenomegaly. Bowel sounds positive.  Musculoskeletal: no clubbing / cyanosis. No joint deformity upper and lower extremities. Good ROM, no contractures. Normal muscle tone.  Skin: Thick yellow scaly eczematous rash on palms of both hands, chronic appearing without open wound or active drainage Neurologic: CN 2-12 grossly intact. Sensation intact. Strength 5/5 in all 4.  Psychiatric: Normal judgment and insight. Alert and oriented x 3. Normal mood.   Labs on Admission: I have personally reviewed following labs and imaging studies  CBC: Recent Labs  Lab 05/24/21 1802 05/24/21 1852  WBC 11.0*  --   NEUTROABS 9.1*  --   HGB 17.0 17.7*  HCT 49.6 52.0  MCV 93.6  --   PLT 242  --    Basic Metabolic Panel: Recent Labs  Lab 05/24/21 1802 05/24/21 1852  NA 127* 130*  K 5.1 5.0  CL 93* 96*  CO2 15*  --   GLUCOSE 869* >700*  BUN 16 17  CREATININE 1.19 1.30*  CALCIUM 8.9  --    GFR: Estimated Creatinine Clearance: 82.1 mL/min (A) (by C-G formula based on SCr of 1.3 mg/dL (H)). Liver Function Tests: Recent Labs  Lab 05/24/21 1802  AST 30  ALT  47*  ALKPHOS 91  BILITOT 1.3*  PROT 7.7  ALBUMIN 4.5   No results for input(s): LIPASE, AMYLASE in the last 168 hours. No results for input(s): AMMONIA in the last 168 hours. Coagulation Profile: No results for input(s): INR, PROTIME in the last 168 hours. Cardiac Enzymes: No results for input(s): CKTOTAL, CKMB, CKMBINDEX, TROPONINI in the last 168 hours. BNP (last 3 results) No results for input(s): PROBNP in the last 8760 hours. HbA1C: No results for input(s): HGBA1C in the last 72 hours. CBG: Recent Labs  Lab 05/24/21 1754 05/24/21 1942  GLUCAP >600* 597*   Lipid Profile: No results for input(s): CHOL, HDL, LDLCALC, TRIG, CHOLHDL, LDLDIRECT in the last 72 hours. Thyroid Function Tests: No results for input(s): TSH, T4TOTAL, FREET4, T3FREE, THYROIDAB in the last 72 hours. Anemia Panel: No results for input(s): VITAMINB12, FOLATE, FERRITIN, TIBC, IRON, RETICCTPCT in the last 72 hours.  Urine analysis: No results found for: COLORURINE, APPEARANCEUR, LABSPEC, PHURINE, GLUCOSEU, HGBUR, BILIRUBINUR, KETONESUR, PROTEINUR, UROBILINOGEN, NITRITE, LEUKOCYTESUR  Radiological Exams on Admission: DG Chest Portable 1 View  Result Date: 05/24/2021 CLINICAL DATA:  Cough EXAM: PORTABLE CHEST 1 VIEW COMPARISON:  None. FINDINGS: The heart size and mediastinal contours are within normal limits. Both lungs are clear. The visualized skeletal structures are unremarkable. IMPRESSION: No active disease. Electronically Signed   By: Jasmine Pang M.D.   On: 05/24/2021 18:18    EKG: Not performed.  Assessment/Plan Principal Problem:   Diabetic ketoacidosis associated with type 2 diabetes mellitus (HCC) Active Problems:   Alcohol use   Tobacco use   Braedin Millhouse is a 55 y.o. male with medical history significant for alcohol and tobacco use who is admitted with DKA.  Diabetic ketoacidosis with new diagnosis of type 2 diabetes: Presenting with serum glucose >800, bicarb 15, anion gap 19,  elevated beta hydroxybutyrate consistent with DKA. -Continue insulin infusion with IV fluids per protocol -Follow serial BMP and transition to subcutaneous insulin when able -Check A1c -Consult to diabetes coordinator as well as TOC for PCP needs -Advised on dietary changes  Alcohol use disorder: Reports drinking a sixpack of beer daily +/- wine/liquor on occasion.  Had several beers prior to arrival.  Serum ethanol elevated at 108.  He is at increased risk for withdrawal.  Start on CIWA protocol with Ativan as needed.  Tobacco use: Smoking at least 1 pack/day for 25 years.  Smoking cessation advised.  Nicotine patch provided.  DVT prophylaxis: Lovenox Code Status: Full code, confirmed on admission Family Communication: Discussed with patient, he has discussed with family Disposition Plan: From home and likely discharge to home pending clinical progress. Consults called: None Level of care: Stepdown Admission status:  Status is: Observation  The patient remains OBS appropriate and will d/c before 2 midnights.  Darreld Mclean MD [facility]  If 7PM-7AM, please contact night-coverage [email or web address]  05/24/2021, 8:15 PM

## 2021-05-24 NOTE — ED Provider Notes (Signed)
[facility]-EMERGENCY DEPT Provider Note   CSN: [id number] Arrival date & time: 05/24/21  1741     History Chief complaint: Hyperglycemia  Walter Turner is a 55 y.o. male.  HPI  Patient states he has not been feeling particularly well in the last couple of weeks.  It started initially with respiratory symptoms with some coughing and congestion.  She also had generalized malaise and fatigue.  He has noticed more recently that he has had to urinate a lot.  He has been very thirsty and drinking a lot of fluids.  Yesterday started having some episodes of nausea and vomiting.  Patient does not have a history of diabetes but family members do.  They checked his blood sugar today and it was very elevated.  Patient does not have a history of diabetes but has not seen a doctor in a number of years.  Patient states when he has checked his blood sugar with his sisters equipment in the past mildly elevated but nothing like this.  He has not had any chest pain.  He is not having any abdominal pain.  Patient does smoke and drinks alcohol regularly.  He has about a sixpack per day.  History reviewed. No pertinent past medical history.  Patient Active Problem List   Diagnosis Date Noted   Diabetic ketoacidosis associated with type 2 diabetes mellitus (HCC) 05/24/2021    History reviewed. No pertinent surgical history.     History reviewed. No pertinent family history.  Social History   Tobacco Use   Smoking status: Every Day    Types: Cigarettes  Substance Use Topics   Alcohol use: Yes    Home Medications Prior to Admission medications   Medication Sig Start Date End Date Taking? Authorizing Provider  acetaminophen (TYLENOL) 500 MG tablet Take 500-1,000 mg by mouth every 6 (six) hours as needed for mild pain or headache.   Yes [provider]  ibuprofen (ADVIL) 200 MG tablet Take 200-400 mg by mouth every 6 (six) hours as needed for headache or mild  pain.   Yes [provider]  multivitamin (ONE-A-DAY MEN'S) TABS tablet Take 1 tablet by mouth daily with breakfast.   Yes [provider]  vitamin C (ASCORBIC ACID) 500 MG tablet Take 500-1,000 mg by mouth daily.   Yes [provider]  cetirizine (ZYRTEC) 10 MG tablet Take 1 tablet (10 mg total) by mouth daily. One tab daily for allergies Patient not taking: Reported on 05/24/2021 10/28/13   Linna Hoff, MD  ipratropium (ATROVENT) 0.06 % nasal spray Place 2 sprays into both nostrils 4 (four) times daily. Patient not taking: Reported on 05/24/2021 10/28/13   Linna Hoff, MD    Allergies    Patient has no known allergies.  Review of Systems   Review of Systems  All other systems reviewed and are negative.  Physical Exam Updated Vital Signs BP 103/79 (BP Location: Right Arm)   Pulse 99   Temp 98.5 F (36.9 C) (Oral)   Resp 20   SpO2 96%   Physical Exam Vitals and nursing note reviewed.  Constitutional:      Appearance: He is well-developed. He is not diaphoretic.  HENT:     Head: Normocephalic and atraumatic.     Right Ear: External ear normal.     Left Ear: External ear normal.  Eyes:     General: No scleral icterus.       Right eye: No discharge.  Left eye: No discharge.     Conjunctiva/sclera: Conjunctivae normal.  Neck:     Trachea: No tracheal deviation.  Cardiovascular:     Rate and Rhythm: Normal rate and regular rhythm.  Pulmonary:     Effort: Pulmonary effort is normal. No respiratory distress.     Breath sounds: Normal breath sounds. No stridor. No wheezing or rales.  Abdominal:     General: Bowel sounds are normal. There is no distension.     Palpations: Abdomen is soft.     Tenderness: There is no abdominal tenderness. There is no guarding or rebound.     Comments: Umbilical hernia, soft reducible  Musculoskeletal:        General: No tenderness or deformity.     Cervical back: Neck supple.  Skin:    General: Skin is  warm and dry.     Findings: No rash.  Neurological:     General: No focal deficit present.     Mental Status: He is alert.     Cranial Nerves: No cranial nerve deficit (no facial droop, extraocular movements intact, no slurred speech).     Sensory: No sensory deficit.     Motor: No abnormal muscle tone or seizure activity.     Coordination: Coordination normal.  Psychiatric:        Mood and Affect: Mood normal.    ED Results / Procedures / Treatments   Labs (all labs ordered are listed, but only abnormal results are displayed) Labs Reviewed  CBC WITH DIFFERENTIAL/PLATELET - Abnormal; Notable for the following components:      Result Value   WBC 11.0 (*)    Neutro Abs 9.1 (*)    All other components within normal limits  BETA-HYDROXYBUTYRIC ACID - Abnormal; Notable for the following components:   Beta-Hydroxybutyric Acid 1.35 (*)    All other components within normal limits  BLOOD GAS, VENOUS - Abnormal; Notable for the following components:   pCO2, Ven 37.6 (*)    pO2, Ven 84.4 (*)    Bicarbonate 17.4 (*)    Acid-base deficit 8.4 (*)    All other components within normal limits  ETHANOL - Abnormal; Notable for the following components:   Alcohol, Ethyl (B) 108 (*)    All other components within normal limits  COMPREHENSIVE METABOLIC PANEL - Abnormal; Notable for the following components:   Sodium 127 (*)    Chloride 93 (*)    CO2 15 (*)    Glucose, Bld 869 (*)    ALT 47 (*)    Total Bilirubin 1.3 (*)    Anion gap 19 (*)    All other components within normal limits  CBG MONITORING, ED - Abnormal; Notable for the following components:   Glucose-Capillary >600 (*)    All other components within normal limits  I-STAT CHEM 8, ED - Abnormal; Notable for the following components:   Sodium 130 (*)    Chloride 96 (*)    Creatinine, Ser 1.30 (*)    Glucose, Bld >700 (*)    Calcium, Ion 1.07 (*)    TCO2 20 (*)    Hemoglobin 17.7 (*)    All other components within normal  limits  RESP PANEL BY RT-PCR (FLU A&B, COVID) ARPGX2  RESP PANEL BY RT-PCR (FLU A&B, COVID) ARPGX2  URINALYSIS, ROUTINE W REFLEX MICROSCOPIC  BASIC METABOLIC PANEL  BASIC METABOLIC PANEL  BASIC METABOLIC PANEL  BASIC METABOLIC PANEL  CBG MONITORING, ED    EKG None  Radiology DG  Chest Portable 1 View  Result Date: 05/24/2021 CLINICAL DATA:  Cough EXAM: PORTABLE CHEST 1 VIEW COMPARISON:  None. FINDINGS: The heart size and mediastinal contours are within normal limits. Both lungs are clear. The visualized skeletal structures are unremarkable. IMPRESSION: No active disease. Electronically Signed   By: Jasmine Pang M.D.   On: 05/24/2021 18:18    Procedures .Critical Care Performed by: Linwood Dibbles, MD Authorized by: Linwood Dibbles, MD   Critical care provider statement:    Critical care time (minutes):  30   Critical care was time spent personally by me on the following activities:  Development of treatment plan with patient or surrogate, discussions with consultants, evaluation of patient's response to treatment, examination of patient, ordering and review of laboratory studies, ordering and review of radiographic studies, ordering and performing treatments and interventions, pulse oximetry, re-evaluation of patient's condition and review of old charts   Medications Ordered in ED Medications  lactated ringers infusion ( Intravenous New Bag/Given 05/24/21 1901)  dextrose 50 % solution 0-50 mL (has no administration in time range)  lactated ringers bolus 20 mL/kg (has no administration in time range)  insulin regular, human (MYXREDLIN) 100 units/ 100 mL infusion ( Intravenous Not Given 05/24/21 1901)    ED Course  I have reviewed the triage vital signs and the nursing notes.  Pertinent labs & imaging results that were available during my care of the patient were reviewed by me and considered in my medical decision making (see chart for details).  Clinical Course as of 05/24/21 1915  Fri  May 24, 2021  1851 Etoh elevated.  Venous pH normal [JK]  1853 Metabolic panel shows decreased bicarb and hyperglycemia.  Elevated anion gap.  Consistent with diabetic ketoacidosis. [JK]  1856 Chest x-ray without signs of pneumonia [JK]    Clinical Course User Index [JK] Linwood Dibbles, MD   MDM Rules/Calculators/A&P                           Patient presented to the ED for evaluation of general malaise and hyperglycemia.  Patient has not seen a doctor in years.  He used his sisters equipment and noted that his blood sugar was very elevated today.  He has had polydipsia and polyuria.  He is also had some general URI type symptoms.  COVID and flu are pending but there is no evidence of pneumonia.  Patient is hemodynamically stable.  His labs are consistent with diabetic ketoacidosis.  He has an elevated anion gap metabolic acidosis.  Patient has been started on IV fluids.  IV insulin has also been ordered.  We will continue to panel closely.  I will consult the medical service for admission Final Clinical Impression(s) / ED Diagnoses Final diagnoses:  Diabetic ketoacidosis without coma associated with type 2 diabetes mellitus (HCC)     Linwood Dibbles, MD 05/24/21 [phone]

## 2021-05-25 DIAGNOSIS — Z720 Tobacco use: Secondary | ICD-10-CM

## 2021-05-25 DIAGNOSIS — Z789 Other specified health status: Secondary | ICD-10-CM

## 2021-05-25 DIAGNOSIS — E1110 Type 2 diabetes mellitus with ketoacidosis without coma: Secondary | ICD-10-CM | POA: Diagnosis present

## 2021-05-25 LAB — BASIC METABOLIC PANEL
Anion gap: 7 (ref 5–15)
Anion gap: 6 (ref 5–15)
BUN: 13 mg/dL (ref 6–20)
BUN: 12 mg/dL (ref 6–20)
CO2: 26 mmol/L (ref 22–32)
CO2: 29 mmol/L (ref 22–32)
Calcium: 9.2 mg/dL (ref 8.9–10.3)
Calcium: 8.6 mg/dL — ABNORMAL LOW (ref 8.9–10.3)
Chloride: 103 mmol/L (ref 98–111)
Chloride: 104 mmol/L (ref 98–111)
Creatinine, Ser: 0.91 mg/dL (ref 0.61–1.24)
Creatinine, Ser: 0.72 mg/dL (ref 0.61–1.24)
GFR, Estimated: >60 mL/min (ref >60)
GFR, Estimated: >60 mL/min (ref >60)
Glucose, Bld: 226 mg/dL — ABNORMAL HIGH (ref 70–99)
Glucose, Bld: 115 mg/dL — ABNORMAL HIGH (ref 70–99)
Potassium: 3.5 mmol/L (ref 3.5–5.1)
Potassium: 3.4 mmol/L — ABNORMAL LOW (ref 3.5–5.1)
Sodium: 136 mmol/L (ref 135–145)
Sodium: 139 mmol/L (ref 135–145)

## 2021-05-25 LAB — CBG MONITORING, ED
Glucose-Capillary: 112 mg/dL — ABNORMAL HIGH (ref 70–99)
Glucose-Capillary: 135 mg/dL — ABNORMAL HIGH (ref 70–99)
Glucose-Capillary: 157 mg/dL — ABNORMAL HIGH (ref 70–99)
Glucose-Capillary: 161 mg/dL — ABNORMAL HIGH (ref 70–99)
Glucose-Capillary: 181 mg/dL — ABNORMAL HIGH (ref 70–99)
Glucose-Capillary: 206 mg/dL — ABNORMAL HIGH (ref 70–99)

## 2021-05-25 LAB — GLUCOSE, CAPILLARY
Glucose-Capillary: 261 mg/dL — ABNORMAL HIGH (ref 70–99)
Glucose-Capillary: 279 mg/dL — ABNORMAL HIGH (ref 70–99)
Glucose-Capillary: 402 mg/dL — ABNORMAL HIGH (ref 70–99)

## 2021-05-25 LAB — HIV ANTIBODY (ROUTINE TESTING W REFLEX): HIV Screen 4th Generation wRfx: NONREACTIVE

## 2021-05-25 MED ORDER — INSULIN ASPART 100 UNIT/ML IJ SOLN
3.0000 [IU] | Freq: Three times a day (TID) | INTRAMUSCULAR | Status: DC
  Administered 2021-05-25 – 2021-05-26 (×4): 3 [IU] via SUBCUTANEOUS

## 2021-05-25 MED ORDER — INSULIN ASPART 100 UNIT/ML IJ SOLN
0.0000 [IU] | Freq: Every day | INTRAMUSCULAR | Status: DC
  Administered 2021-05-25: 5 [IU] via SUBCUTANEOUS

## 2021-05-25 MED ORDER — DEXTROSE IN LACTATED RINGERS 5 % IV SOLN: INTRAVENOUS | Status: DC

## 2021-05-25 MED ORDER — LACTATED RINGERS IV SOLN: INTRAVENOUS | Status: DC

## 2021-05-25 MED ORDER — INSULIN ASPART 100 UNIT/ML IJ SOLN
0.0000 [IU] | Freq: Three times a day (TID) | INTRAMUSCULAR | Status: DC
  Administered 2021-05-25: 5 [IU] via SUBCUTANEOUS
  Administered 2021-05-25: 1 [IU] via SUBCUTANEOUS
  Filled 2021-05-25: qty 0.09

## 2021-05-25 MED ORDER — ONDANSETRON HCL 4 MG/2ML IJ SOLN
4.0000 mg | Freq: Four times a day (QID) | INTRAMUSCULAR | Status: DC | PRN
  Administered 2021-05-26: 4 mg via INTRAVENOUS
  Filled 2021-05-25: qty 2

## 2021-05-25 MED ORDER — POTASSIUM CHLORIDE CRYS ER 20 MEQ PO TBCR
40.0000 meq | EXTENDED_RELEASE_TABLET | Freq: Once | ORAL | Status: AC
  Administered 2021-05-25: 40 meq via ORAL
  Filled 2021-05-25: qty 2

## 2021-05-25 MED ORDER — INSULIN ASPART 100 UNIT/ML IJ SOLN
0.0000 [IU] | Freq: Three times a day (TID) | INTRAMUSCULAR | Status: DC
Start: 2021-05-25 — End: 2021-05-27
  Administered 2021-05-25: 8 [IU] via SUBCUTANEOUS
  Administered 2021-05-26: 5 [IU] via SUBCUTANEOUS
  Administered 2021-05-26: 8 [IU] via SUBCUTANEOUS
  Administered 2021-05-26: 11 [IU] via SUBCUTANEOUS

## 2021-05-25 MED ORDER — INSULIN GLARGINE-YFGN 100 UNIT/ML ~~LOC~~ SOLN
15.0000 [IU] | SUBCUTANEOUS | Status: DC
  Administered 2021-05-25 (×2): 15 [IU] via SUBCUTANEOUS
  Filled 2021-05-25 (×4): qty 0.15

## 2021-05-25 MED ORDER — LIVING WELL WITH DIABETES BOOK
Freq: Once | Status: AC
  Filled 2021-05-25: qty 1

## 2021-05-25 MED ORDER — INSULIN GLARGINE-YFGN 100 UNIT/ML ~~LOC~~ SOLN
30.0000 [IU] | Freq: Every day | SUBCUTANEOUS | Status: DC
  Administered 2021-05-26: 30 [IU] via SUBCUTANEOUS
  Filled 2021-05-25 (×2): qty 0.3

## 2021-05-25 NOTE — Progress Notes (Signed)
PROGRESS NOTE    Kasin Tonkinson  AVW:[id number] DOB: 10/12/1965 DOA: 05/24/2021 PCP: Pcp, No    Brief Narrative:  55 y/o male admitted with new onset diabetes and DKA. Treated with IV fluids and insulin with improvement. Also has history of ETOH use and is on CIWA protocol.   Assessment & Plan:   Principal Problem:   Diabetic ketoacidosis associated with type 2 diabetes mellitus (HCC) Active Problems:   Alcohol use   Tobacco use   DKA (diabetic ketoacidosis) (HCC)   DKA with new diagnosis of type 2 diabetes mellitus -treated with insulin and IV fluids -anion gap has closed, serum bicarb has improved -transitioned off insulin infusion to basal insulin earlier this morning -will monitor blood sugars on insulin glargine/novolog to ensure they do not spike back up when he starts eating -A1c pending -diabetic coordinator consult -suspect he will need to discharge on insulin -patient does not have insurance or PCP, will request TOC assistance  Alcohol use -currently on CIWA protocol -no signs of withdrawal at present -continue to monitor  Hypokalemia -replace  Pseudohyponatremia -resolved with correction of blood sugars  Tobacco use -counseled on cessation   DVT prophylaxis: enoxaparin (LOVENOX) injection 40 mg Start: 05/24/21 2200  Code Status: full code Family Communication: discussed with patient Disposition Plan: Status is: Inpatient  Remains inpatient appropriate because: continued management of blood sugars         Consultants:    Procedures:    Antimicrobials:      Subjective: Feels better. No nausea or vomiting, no abd pain  Objective: Vitals:   05/25/21 1000 05/25/21 1114 05/25/21 1352 05/25/21 2125  BP: (!) 143/93 (!) 151/91 (!) 145/98 (!) 151/77  Pulse: 88 86 84 82  Resp: 18 18 16 18   Temp:  97.9 F (36.6 C) 97.9 F (36.6 C) 98.2 F (36.8 C)  TempSrc:  Oral Oral Oral  SpO2: 99% 98% 98% 97%  Weight:      Height:         Intake/Output Summary (Last 24 hours) at 05/25/2021 2312 Last data filed at 05/25/2021 2200 Gross per 24 hour  Intake 908.7 ml  Output 1500 ml  Net -591.3 ml   Filed Weights   05/24/21 1921  Weight: 104.3 kg    Examination:  General exam: Appears calm and comfortable  Respiratory system: Clear to auscultation. Respiratory effort normal. Cardiovascular system: S1 & S2 heard, RRR. No JVD, murmurs, rubs, gallops or clicks. No pedal edema. Gastrointestinal system: Abdomen is nondistended, soft and nontender. No organomegaly or masses felt. Normal bowel sounds heard. Central nervous system: Alert and oriented. No focal neurological deficits. Extremities: Symmetric 5 x 5 power. Skin: No rashes, lesions or ulcers Psychiatry: Judgement and insight appear normal. Mood & affect appropriate.     Data Reviewed: I have personally reviewed following labs and imaging studies  CBC: Recent Labs  Lab 05/24/21 1802 05/24/21 1852  WBC 11.0*  --   NEUTROABS 9.1*  --   HGB 17.0 17.7*  HCT 49.6 52.0  MCV 93.6  --   PLT 242  --    Basic Metabolic Panel: Recent Labs  Lab 05/24/21 1802 05/24/21 1852 05/24/21 2300 05/25/21 0500  NA 127* 130* 136 139  K 5.1 5.0 3.5 3.4*  CL 93* 96* 103 104  CO2 15*  --  26 29  GLUCOSE 869* >700* 226* 115*  BUN 16 17 13 12   CREATININE 1.19 1.30* 0.91 0.72  CALCIUM 8.9  --  9.2 8.6*  GFR: Estimated Creatinine Clearance: 133.4 mL/min (by C-G formula based on SCr of 0.72 mg/dL). Liver Function Tests: Recent Labs  Lab 05/24/21 1802  AST 30  ALT 47*  ALKPHOS 91  BILITOT 1.3*  PROT 7.7  ALBUMIN 4.5   No results for input(s): LIPASE, AMYLASE in the last 168 hours. No results for input(s): AMMONIA in the last 168 hours. Coagulation Profile: No results for input(s): INR, PROTIME in the last 168 hours. Cardiac Enzymes: No results for input(s): CKTOTAL, CKMB, CKMBINDEX, TROPONINI in the last 168 hours. BNP (last 3 results) No results for  input(s): PROBNP in the last 8760 hours. HbA1C: No results for input(s): HGBA1C in the last 72 hours. CBG: Recent Labs  Lab 05/25/21 0600 05/25/21 0715 05/25/21 1222 05/25/21 1718 05/25/21 2121  GLUCAP 112* 135* 261* 279* 402*   Lipid Profile: No results for input(s): CHOL, HDL, LDLCALC, TRIG, CHOLHDL, LDLDIRECT in the last 72 hours. Thyroid Function Tests: No results for input(s): TSH, T4TOTAL, FREET4, T3FREE, THYROIDAB in the last 72 hours. Anemia Panel: No results for input(s): VITAMINB12, FOLATE, FERRITIN, TIBC, IRON, RETICCTPCT in the last 72 hours. Sepsis Labs: No results for input(s): PROCALCITON, LATICACIDVEN in the last 168 hours.  Recent Results (from the past 240 hour(s))  Resp Panel by RT-PCR (Flu A&B, Covid)     Status: None   Collection Time: 05/24/21  6:23 PM  Result Value Ref Range Status   SARS Coronavirus 2 by RT PCR NEGATIVE NEGATIVE Final    Comment: (NOTE) SARS-CoV-2 target nucleic acids are NOT DETECTED.  The SARS-CoV-2 RNA is generally detectable in upper respiratory specimens during the acute phase of infection. The lowest concentration of SARS-CoV-2 viral copies this assay can detect is 138 copies/mL. A negative result does not preclude SARS-Cov-2 infection and should not be used as the sole basis for treatment or other patient management decisions. A negative result may occur with  improper specimen collection/handling, submission of specimen other than nasopharyngeal swab, presence of viral mutation(s) within the areas targeted by this assay, and inadequate number of viral copies(<138 copies/mL). A negative result must be combined with clinical observations, patient history, and epidemiological information. The expected result is Negative.  Fact Sheet for Patients:  BloggerCourse.com  Fact Sheet for Healthcare Providers:  SeriousBroker.it  This test is no t yet approved or cleared by the  [place] FDA and  has been authorized for detection and/or diagnosis of SARS-CoV-2 by FDA under an Emergency Use Authorization (EUA). This EUA will remain  in effect (meaning this test can be used) for the duration of the COVID-19 declaration under Section 564(b)(1) of the Act, 21 U.S.C.section 360bbb-3(b)(1), unless the authorization is terminated  or revoked sooner.       Influenza A by PCR NEGATIVE NEGATIVE Final   Influenza B by PCR NEGATIVE NEGATIVE Final    Comment: (NOTE) The Xpert Xpress SARS-CoV-2/FLU/RSV plus assay is intended as an aid in the diagnosis of influenza from Nasopharyngeal swab specimens and should not be used as a sole basis for treatment. Nasal washings and aspirates are unacceptable for Xpert Xpress SARS-CoV-2/FLU/RSV testing.  Fact Sheet for Patients: BloggerCourse.com  Fact Sheet for Healthcare Providers: SeriousBroker.it  This test is not yet approved or cleared by the [place] FDA and has been authorized for detection and/or diagnosis of SARS-CoV-2 by FDA under an Emergency Use Authorization (EUA). This EUA will remain in effect (meaning this test can be used) for the duration of the COVID-19 declaration under Section 564(b)(1) of  the Act, 21 U.S.C. section 360bbb-3(b)(1), unless the authorization is terminated or revoked.  Performed at [facility], [address], [place], [place] [address]          Radiology Studies: DG Chest Portable 1 View  Result Date: 05/24/2021 CLINICAL DATA:  Cough EXAM: PORTABLE CHEST 1 VIEW COMPARISON:  None. FINDINGS: The heart size and mediastinal contours are within normal limits. Both lungs are clear. The visualized skeletal structures are unremarkable. IMPRESSION: No active disease. Electronically Signed   By: Jasmine Pang M.D.   On: 05/24/2021 18:18        Scheduled Meds:  enoxaparin (LOVENOX) injection  40 mg Subcutaneous  Q24H   folic acid  1 mg Oral Daily   insulin aspart  0-15 Units Subcutaneous TID WC   insulin aspart  0-5 Units Subcutaneous QHS   insulin aspart  3 Units Subcutaneous TID WC   [START ON 05/26/2021] insulin glargine-yfgn  30 Units Subcutaneous Q24H   multivitamin with minerals  1 tablet Oral Daily   nicotine  21 mg Transdermal Daily   thiamine  100 mg Oral Daily   Or   thiamine  100 mg Intravenous Daily   Continuous Infusions:  lactated ringers 100 mL/hr at 05/25/21 1134     LOS: 0 days    Time spent: 35 mins    Erick Blinks, MD [facility]   If 7PM-7AM, please contact night-coverage [email or web address]  05/25/2021, 11:12 PM

## 2021-05-25 NOTE — Progress Notes (Signed)
Inpatient Diabetes Program Recommendations  AACE/ADA: New Consensus Statement on Inpatient Glycemic Control (2015)  Target Ranges:  Prepandial:   less than 140 mg/dL      Peak postprandial:   less than 180 mg/dL (1-2 hours)      Critically ill patients:  140 - 180 mg/dL   Lab Results  Component Value Date   GLUCAP 261 (H) 05/25/2021    Review of Glycemic Control  Diabetes history: new onset type 2 Outpatient Diabetes medications: none Current orders for Inpatient glycemic control: Semglee 15 units daily, Novolog SENSITIVE correction scale TID  Inpatient Diabetes Program Recommendations:   Noted that patient admitted for new onset diabetes. Has been having polyuria and polydipsia.   Recommend increasing Novolog correction scale to MODERATE and add Novolog 3 units TID with meals if eating at least 50% of meal.  Titrate dosages as needed.   Will continue to follow while in the hospital.  Smith Mince RN BSN CDE Diabetes Coordinator Pager: [phone]  8am-5pm

## 2021-05-26 LAB — BASIC METABOLIC PANEL
Anion gap: 6 (ref 5–15)
BUN: 16 mg/dL (ref 6–20)
CO2: 25 mmol/L (ref 22–32)
Calcium: 8.5 mg/dL — ABNORMAL LOW (ref 8.9–10.3)
Chloride: 104 mmol/L (ref 98–111)
Creatinine, Ser: 0.78 mg/dL (ref 0.61–1.24)
GFR, Estimated: >60 mL/min (ref >60)
Glucose, Bld: 238 mg/dL — ABNORMAL HIGH (ref 70–99)
Potassium: 4.6 mmol/L (ref 3.5–5.1)
Sodium: 135 mmol/L (ref 135–145)

## 2021-05-26 LAB — GLUCOSE, CAPILLARY
Glucose-Capillary: 222 mg/dL — ABNORMAL HIGH (ref 70–99)
Glucose-Capillary: 262 mg/dL — ABNORMAL HIGH (ref 70–99)
Glucose-Capillary: 274 mg/dL — ABNORMAL HIGH (ref 70–99)
Glucose-Capillary: 281 mg/dL — ABNORMAL HIGH (ref 70–99)
Glucose-Capillary: 305 mg/dL — ABNORMAL HIGH (ref 70–99)
Glucose-Capillary: 330 mg/dL — ABNORMAL HIGH (ref 70–99)

## 2021-05-26 MED ORDER — INSULIN PEN NEEDLE 31G X 8 MM MISC
1 refills | Status: AC
  Filled 2021-05-26: qty 100, 90d supply, fill #0
  Filled 2021-05-27: qty 100, 30d supply, fill #0

## 2021-05-26 MED ORDER — STERILE WATER FOR INJECTION IJ SOLN
INTRAMUSCULAR | Status: AC
  Filled 2021-05-26: qty 10

## 2021-05-26 MED ORDER — INSULIN GLARGINE-YFGN 100 UNIT/ML ~~LOC~~ SOLN
40.0000 [IU] | Freq: Every day | SUBCUTANEOUS | Status: DC
  Administered 2021-05-26: 40 [IU] via SUBCUTANEOUS
  Filled 2021-05-26: qty 0.4

## 2021-05-26 MED ORDER — BLOOD GLUCOSE MONITOR SYSTEM W/DEVICE KIT
PACK | 0 refills | Status: AC
  Filled 2021-05-26 – 2021-05-27 (×2): qty 1, 1d supply, fill #0

## 2021-05-26 MED ORDER — INSULIN DETEMIR 100 UNIT/ML FLEXPEN
40.0000 [IU] | PEN_INJECTOR | Freq: Every day | SUBCUTANEOUS | 0 refills | Status: AC
  Filled 2021-05-26 – 2021-05-27 (×2): qty 9, 22d supply, fill #0

## 2021-05-26 MED ORDER — METFORMIN HCL 500 MG PO TABS
500.0000 mg | ORAL_TABLET | Freq: Two times a day (BID) | ORAL | 11 refills | Status: AC
  Filled 2021-05-26 – 2021-05-27 (×2): qty 60, 30d supply, fill #0

## 2021-05-26 NOTE — Discharge Summary (Signed)
Physician Discharge Summary  Walter Turner PPJ:[id number] DOB: Jan 23, 1966 DOA: 05/24/2021  PCP: Pcp, No referred to [facility]  Admit date: 05/24/2021 Discharge date: 05/26/2021  Admitted From: Home Disposition: Home  Recommendations for Outpatient Follow-up:  Patient will establish care with primary care physician for further management of his blood sugars  Home Health: Equipment/Devices:   Discharge Condition: Stable CODE STATUS: Full code Diet recommendation: Heart healthy, carb modified  Brief/Interim Summary: 55 year old male admitted with new onset diabetes and DKA, treated with IV insulin and IV fluids.  Discharge Diagnoses:  Principal Problem:   Diabetic ketoacidosis associated with type 2 diabetes mellitus ([place]) Active Problems:   Alcohol use   Tobacco use   DKA (diabetic ketoacidosis) ([place])  DKA with new diagnosis of type 2 diabetes mellitus -treated with insulin and IV fluids -anion gap has closed, serum bicarb has improved -transitioned off insulin infusion to basal insulin  -A1c still pending at the time of discharge -diabetic coordinator consulted, appreciate assistance -He will be discharged on basal insulin and metformin -He received diabetic teaching -Seen by [facility] and provided match voucher to help obtain his medications -Given information to establish primary care   Alcohol use -currently on CIWA protocol -no signs of withdrawal at present -continue to monitor   Hypokalemia -replace   Pseudohyponatremia -resolved with correction of blood sugars   Tobacco use -counseled on cessation  Discharge Instructions  Discharge Instructions     Ambulatory referral to Nutrition and Diabetic Education   Complete by: As directed    Diet - low sodium heart healthy   Complete by: As directed    Increase activity slowly   Complete by: As directed       Allergies as of 05/26/2021   No Known Allergies       Medication List     STOP taking these medications    cetirizine 10 MG tablet Commonly known as: ZYRTEC   ipratropium 0.06 % nasal spray Commonly known as: Atrovent       TAKE these medications    acetaminophen 500 MG tablet Commonly known as: TYLENOL Take 500-1,000 mg by mouth every 6 (six) hours as needed for mild pain or headache.   blood glucose meter kit and supplies Dispense based on patient and insurance preference. Use up to four times daily as directed. (FOR ICD-10 E10.9, E11.9).   ibuprofen 200 MG tablet Commonly known as: ADVIL Take 200-400 mg by mouth every 6 (six) hours as needed for headache or mild pain.   insulin detemir 100 unit/ml Soln Commonly known as: LEVEMIR Inject 40 Units into the skin at bedtime.   metFORMIN 500 MG tablet Commonly known as: Glucophage Take 1 tablet (500 mg total) by mouth 2 (two) times daily with a meal.   multivitamin Tabs tablet Take 1 tablet by mouth daily with breakfast.   Pen Needles 31G X 8 MM Misc Use as directed   vitamin C 500 MG tablet Commonly known as: ASCORBIC ACID Take 500-1,000 mg by mouth daily.        Follow-up Information     [place]. Call.   Why: Call to set up a hospital follow up appointment. Contact information: [place] [phone]               No Known Allergies  Consultations:    Procedures/Studies: DG Chest Portable 1 View  Result Date: 05/24/2021 CLINICAL DATA:  Cough EXAM: PORTABLE CHEST 1 VIEW COMPARISON:  None. FINDINGS: The heart size and mediastinal contours are within normal limits. Both lungs are clear. The visualized skeletal structures are unremarkable. IMPRESSION: No active disease. Electronically Signed   By: Donavan Foil M.D.   On: 05/24/2021 18:18      Subjective: Patient is feeling well today does not have any complaints.  Discharge Exam: Vitals:   05/25/21 2125  05/26/21 0619 05/26/21 0900 05/26/21 1343  BP: (!) 151/77 (!) 132/92 140/78 (!) 155/103  Pulse: 82 71 84 87  Resp: 18 18  14   Temp: 98.2 F (36.8 C) 98 F (36.7 C)  98.4 F (36.9 C)  TempSrc: Oral Oral  Oral  SpO2: 97% 97%  95%  Weight:      Height:        General: Pt is alert, awake, not in acute distress Cardiovascular: RRR, S1/S2 +, no rubs, no gallops Respiratory: CTA bilaterally, no wheezing, no rhonchi Abdominal: Soft, NT, ND, bowel sounds + Extremities: no edema, no cyanosis    The results of significant diagnostics from this hospitalization (including imaging, microbiology, ancillary and laboratory) are listed below for reference.     Microbiology: Recent Results (from the past 240 hour(s))  Resp Panel by RT-PCR (Flu A&B, Covid)     Status: None   Collection Time: 05/24/21  6:23 PM  Result Value Ref Range Status   SARS Coronavirus 2 by RT PCR NEGATIVE NEGATIVE Final    Comment: (NOTE) SARS-CoV-2 target nucleic acids are NOT DETECTED.  The SARS-CoV-2 RNA is generally detectable in upper respiratory specimens during the acute phase of infection. The lowest concentration of SARS-CoV-2 viral copies this assay can detect is 138 copies/mL. A negative result does not preclude SARS-Cov-2 infection and should not be used as the sole basis for treatment or other patient management decisions. A negative result may occur with  improper specimen collection/handling, submission of specimen other than nasopharyngeal swab, presence of viral mutation(s) within the areas targeted by this assay, and inadequate number of viral copies(<138 copies/mL). A negative result must be combined with clinical observations, patient history, and epidemiological information. The expected result is Negative.  Fact Sheet for Patients:  EntrepreneurPulse.com.au  Fact Sheet for Healthcare Providers:  IncredibleEmployment.be  This test is no t yet approved or  cleared by the [place] FDA and  has been authorized for detection and/or diagnosis of SARS-CoV-2 by FDA under an Emergency Use Authorization (EUA). This EUA will remain  in effect (meaning this test can be used) for the duration of the COVID-19 declaration under Section 564(b)(1) of the Act, 21 U.S.C.section 360bbb-3(b)(1), unless the authorization is terminated  or revoked sooner.       Influenza A by PCR NEGATIVE NEGATIVE Final   Influenza B by PCR NEGATIVE NEGATIVE Final    Comment: (NOTE) The Xpert Xpress SARS-CoV-2/FLU/RSV plus assay is intended as an aid in the diagnosis of influenza from Nasopharyngeal swab specimens and should not be used as a sole basis for treatment. Nasal washings and aspirates are unacceptable for Xpert Xpress SARS-CoV-2/FLU/RSV testing.  Fact Sheet for Patients: EntrepreneurPulse.com.au  Fact Sheet for Healthcare Providers: IncredibleEmployment.be  This test is not yet approved or cleared by the [place] FDA and has been authorized for detection and/or diagnosis of SARS-CoV-2 by FDA under an Emergency Use Authorization (EUA). This EUA will remain in effect (meaning this test can be used) for the duration of the COVID-19 declaration under Section 564(b)(1) of the Act, 21  U.S.C. section 360bbb-3(b)(1), unless the authorization is terminated or revoked.  Performed at [facility], [place] [address], [place], [place] [address]      Labs: BNP (last 3 results) No results for input(s): BNP in the last 8760 hours. Basic Metabolic Panel: Recent Labs  Lab 05/24/21 1802 05/24/21 1852 05/24/21 2300 05/25/21 0500 05/26/21 0451  NA 127* 130* 136 139 135  K 5.1 5.0 3.5 3.4* 4.6  CL 93* 96* 103 104 104  CO2 15*  --  26 29 25   GLUCOSE 869* >700* 226* 115* 238*  BUN 16 17 13 12 16   CREATININE 1.19 1.30* 0.91 0.72 0.78  CALCIUM 8.9  --  9.2 8.6* 8.5*   Liver Function Tests: Recent Labs   Lab 05/24/21 1802  AST 30  ALT 47*  ALKPHOS 91  BILITOT 1.3*  PROT 7.7  ALBUMIN 4.5   No results for input(s): LIPASE, AMYLASE in the last 168 hours. No results for input(s): AMMONIA in the last 168 hours. CBC: Recent Labs  Lab 05/24/21 1802 05/24/21 1852  WBC 11.0*  --   NEUTROABS 9.1*  --   HGB 17.0 17.7*  HCT 49.6 52.0  MCV 93.6  --   PLT 242  --    Cardiac Enzymes: No results for input(s): CKTOTAL, CKMB, CKMBINDEX, TROPONINI in the last 168 hours. BNP: Invalid input(s): POCBNP CBG: Recent Labs  Lab 05/26/21 0751 05/26/21 1144 05/26/21 1627 05/26/21 1837 05/26/21 1840  GLUCAP 222* 262* 305* 274* 281*   D-Dimer No results for input(s): DDIMER in the last 72 hours. Hgb A1c No results for input(s): HGBA1C in the last 72 hours. Lipid Profile No results for input(s): CHOL, HDL, LDLCALC, TRIG, CHOLHDL, LDLDIRECT in the last 72 hours. Thyroid function studies No results for input(s): TSH, T4TOTAL, T3FREE, THYROIDAB in the last 72 hours.  Invalid input(s): FREET3 Anemia work up No results for input(s): VITAMINB12, FOLATE, FERRITIN, TIBC, IRON, RETICCTPCT in the last 72 hours. Urinalysis    Component Value Date/Time   COLORURINE YELLOW 05/24/2021 1802   APPEARANCEUR CLEAR 05/24/2021 1802   LABSPEC <1.005 (L) 05/24/2021 1802   PHURINE 5.5 05/24/2021 1802   GLUCOSEU >=500 (A) 05/24/2021 1802   HGBUR NEGATIVE 05/24/2021 1802   BILIRUBINUR NEGATIVE 05/24/2021 1802   KETONESUR 15 (A) 05/24/2021 1802   PROTEINUR NEGATIVE 05/24/2021 1802   NITRITE NEGATIVE 05/24/2021 1802   LEUKOCYTESUR NEGATIVE 05/24/2021 1802   Sepsis Labs Invalid input(s): PROCALCITONIN,  WBC,  LACTICIDVEN Microbiology Recent Results (from the past 240 hour(s))  Resp Panel by RT-PCR (Flu A&B, Covid)     Status: None   Collection Time: 05/24/21  6:23 PM  Result Value Ref Range Status   SARS Coronavirus 2 by RT PCR NEGATIVE NEGATIVE Final    Comment: (NOTE) SARS-CoV-2 target nucleic  acids are NOT DETECTED.  The SARS-CoV-2 RNA is generally detectable in upper respiratory specimens during the acute phase of infection. The lowest concentration of SARS-CoV-2 viral copies this assay can detect is 138 copies/mL. A negative result does not preclude SARS-Cov-2 infection and should not be used as the sole basis for treatment or other patient management decisions. A negative result may occur with  improper specimen collection/handling, submission of specimen other than nasopharyngeal swab, presence of viral mutation(s) within the areas targeted by this assay, and inadequate number of viral copies(<138 copies/mL). A negative result must be combined with clinical observations, patient history, and epidemiological information. The expected result is Negative.  Fact Sheet for Patients:  EntrepreneurPulse.com.au  Fact Sheet for Healthcare Providers:  IncredibleEmployment.be  This test is no t yet approved or cleared by the [place] FDA and  has been authorized for detection and/or diagnosis of SARS-CoV-2 by FDA under an Emergency Use Authorization (EUA). This EUA will remain  in effect (meaning this test can be used) for the duration of the COVID-19 declaration under Section 564(b)(1) of the Act, 21 U.S.C.section 360bbb-3(b)(1), unless the authorization is terminated  or revoked sooner.       Influenza A by PCR NEGATIVE NEGATIVE Final   Influenza B by PCR NEGATIVE NEGATIVE Final    Comment: (NOTE) The Xpert Xpress SARS-CoV-2/FLU/RSV plus assay is intended as an aid in the diagnosis of influenza from Nasopharyngeal swab specimens and should not be used as a sole basis for treatment. Nasal washings and aspirates are unacceptable for Xpert Xpress SARS-CoV-2/FLU/RSV testing.  Fact Sheet for Patients: EntrepreneurPulse.com.au  Fact Sheet for Healthcare Providers: IncredibleEmployment.be  This  test is not yet approved or cleared by the [place] FDA and has been authorized for detection and/or diagnosis of SARS-CoV-2 by FDA under an Emergency Use Authorization (EUA). This EUA will remain in effect (meaning this test can be used) for the duration of the COVID-19 declaration under Section 564(b)(1) of the Act, 21 U.S.C. section 360bbb-3(b)(1), unless the authorization is terminated or revoked.  Performed at [facility], [place] [address], [place], [place] [address]      Time coordinating discharge: 79mns  SIGNED:   JKathie Dike MD  [facility] 05/26/2021, 9:24 PM   If 7PM-7AM, please contact night-coverage [email or web address]

## 2021-05-26 NOTE — Discharge Instructions (Signed)
Outpatient Substance Use Treatment Services   [facility] Out[facility] [address] [place], [place] [address]  [phone] Private insurance, Medicare A&B, and GCCN   [facility]  [address]  [place], [place] [address] [phone] Medicaid, Self Pay   [facility]      [address]  [place], [place] [phone] Medicaid and Private Insurance, Self Pay   [facility] [address]  [place], [place]  [phone] Private Insurance, and Self Pay  [facility]      [address]    [place], [place] [address]  [phone] or [phone] Private Insurance Only   [facility] [address]  [place], [place] [address] [phone] Medicaid, Medicare, Private Insurance  [facility] [address]  [place], [place] [address] [phone] Services are free or reduced  [facility]  [address] [phone]  Self Pay only, sliding scale  [facility]  [address]  [place], [place] [address] [phone] ([facility]) Self Pay, Medicaid Only   [facility] [address]  [place], [place] [address] [phone] Medicaid, Medicare, Private Insurance  Residential Substance Use Treatment Services   [facility]  [address]  [place], [place] [address]  [phone] or [phone] Detox (Medicare, Medicaid, private insurance, and self pay)  Residential Rehab 14 days (Medicare, Medicaid, private insurance, and self pay)   [facility]  [address] [place], [place]  [phone]  Male and Male Detox (Self Pay and Medicaid limited availability)  Rehab only Male (Medicaid and self pay only)   [facility]      [address]   [place], [place] [address]  [phone] or [phone] Detox and Residential Treatment Private Insurance Only   [facility]  [address]  [place], [place] [address]  [phone]  Treatment Only, must make assessment appointment, and must be sober for assessment appointment.  Self Pay Only, Medicare A&B, [place] Medicaid, [place] ID only! *Transportation assistance offered from [employer] [address]  [facility]     [address] [place], [place] [address] Walk in interviews M-Sat 8-4p No pending legal charges [phone]  [facility]:  [facility]  [address] [place], [place] [phone] (Self Pay, Medicaid)  [facility] [address] [place], [place] [address] [phone] Detox and Residential Treatment Medicare and Private Insurance  [facility] [address]  [place], [place] [address] 28 Day Women's Facility: [phone] 28 Day Men's Facility: [phone] Long-term Residential Program:  [phone] Males 25 and Over (No Insurance, upfront fee)  [facility]  [address] [place], [place] [address] [phone] Private Insurance with Cign[employer], Private Pay  [facility] [address] [place], [place] [address] Local [phone] Private Insurance Only  [facility] [address]  , [place] [address]  [phone] (Males, upfront fee)  [facility] [place] [address]  [place], [address] [phone] Private Insurance    [facility] Locations  [facility]  [address]  [place], [place]  [phone] Christian Based Program for individuals experiencing homelessness Self Pay, No insurance  [facility]  Men's program: [facility] [address]  [place],  [address] [phone]  [facility] Women's [facility]: [place] [facility] [address]   [place], [place] [address] [phone] Christian Based Program for individuals  experiencing homelessness Self Pay, No insurance  [facility] [address]  [place], [place] [address]  [phone] Christian Based Program for individuals experiencing homelessness Self Pay, No insurance  [facility] [address]  [place], [place] [address] [phone] Christian Based Program for individuals experiencing homelessness Self Pay, No insurance  [facility] [address] [place], [place] [phone] Christian Based Program for males experiencing homelessness Self Pay, No insurance                    

## 2021-05-26 NOTE — Progress Notes (Signed)
Inpatient Diabetes Program Recommendations  AACE/ADA: New Consensus Statement on Inpatient Glycemic Control (2015)  Target Ranges:  Prepandial:   less than 140 mg/dL      Peak postprandial:   less than 180 mg/dL (1-2 hours)      Critically ill patients:  140 - 180 mg/dL   Lab Results  Component Value Date   GLUCAP 262 (H) 05/26/2021    Review of Glycemic Control  Diabetes history: type 2 new onset Outpatient Diabetes medications: new onset Current orders for Inpatient glycemic control: Semglee 30 units daily, Novolog MODERATE correction scale TID & HS scale, Novolog 3 units TID  Inpatient Diabetes Program Recommendations:   Spoke with patient on the phone. States that his aunt, sister, and mother have diabetes. His wife is a Marine scientist. 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 He understands that he will need to take insulin and will need to find a PCP to follow him. His HgbA1C is still pending.  States that he was drinking lots of juice, soda when he was feeling so thirsty. He knew that something was not right for the past few weeks. He seems to be eager to follow a plan to control his diabetes. Living Well with Diabetes booklet was ordered for him.   He will need affordable insulin such as [employer] insulin or get a 30 day voucher letter for the insulin until he can find a PCP. Will need prescription for home blood glucose meter kit (order # [id number]) at discharge.   Harvel Ricks RN BSN CDE Diabetes Coordinator Pager: [phone]  8am-5pm

## 2021-05-26 NOTE — Progress Notes (Signed)
Reviewed w pt insulin syringes, how to draw up insulin (using a 10 ml bottle of sterile water), injection sites, need to rotate sites, actual injection technique. Allowed pt to watch me, then do the above himself. Also allowed pt to give himself 5 units of sterile water. Then allowed pt to give himself his supper insulin coverage (that I had drawn up). Pt gave good return demo and was able to verbalize the basic principles of above. All questions answered and he verbalized understanding. Also worked w pt to practice taking his own CBG using our CBG meter from the floor. Again, pt was able to perform this on himself and gave a good verbalization/demonstration of this. All questions answered and he verbalized understanding. Pt understands that he needs to go to [facility] in the am and pick up his supplies, and also to make an appt w [facility] in the am.

## 2021-05-26 NOTE — Progress Notes (Signed)
Patient alert and oriented/verbalized understanding of discharge paperwork and given long acting insulin (Semglee) per MD order. Patient carried down via wheelchair by NT and discharged home with family.

## 2021-05-26 NOTE — TOC Initial Note (Signed)
Transition of Care [facility]) - Initial/Assessment Note    Patient Details  Name: Walter Turner MRN: [id number] Date of Birth: Aug 11, 1965  [facility] CM/SW Contact:    Darleene Cleaver, LCSW Phone Number: 05/26/2021, 12:11 PM  Clinical Narrative:                  CSW was informed that patient does not have insurance or a PCP and is a new diabetic.  Patient also has history of substance abuse.  CSW was able to provide a Match Letter to help patient pay for his insulin, and other medications.  CSW also provided contact information for substance abuse resources on the AVS.  Patient does not have a PCP, CSW provided contact information for [facility] and [facility], for him to call and make an appointment for a hospital follow up.  CSW to continue to follow patient's progress throughout discharge planning.  Expected Discharge Plan: Home/Self Care Barriers to Discharge: Continued Medical Work up   Patient Goals and CMS Choice Patient states their goals for this hospitalization and ongoing recovery are:: To return back home      Expected Discharge Plan and Services Expected Discharge Plan: Home/Self Care In-house Referral: Clinical Social Work                                            Prior Living Arrangements/Services   Lives with:: Self Patient language and need for interpreter reviewed:: Yes Do you feel safe going back to the place where you live?: Yes      Need for Family Participation in Patient Care: Yes (Comment) Care giver support system in place?: No (comment)   Criminal Activity/Legal Involvement Pertinent to Current Situation/Hospitalization: No - Comment as needed  Activities of Daily Living Home Assistive Devices/Equipment: None ADL Screening (condition at time of admission) Patient's cognitive ability adequate to safely complete daily activities?: Yes Is the patient deaf or have difficulty hearing?: No Does the patient have  difficulty seeing, even when wearing glasses/contacts?: No Does the patient have difficulty concentrating, remembering, or making decisions?: No Patient able to express need for assistance with ADLs?: Yes Does the patient have difficulty dressing or bathing?: No Independently performs ADLs?: No Communication: Independent Dressing (OT): Independent Grooming: Independent Feeding: Independent Bathing: Independent Toileting: Independent In/Out Bed: Independent Walks in Home: Independent Does the patient have difficulty walking or climbing stairs?: No Weakness of Legs: None Weakness of Arms/Hands: None  Permission Sought/Granted Permission sought to share information with : Case Manager, Family Supports Permission granted to share information with : Yes, Release of Information Signed  Share Information with NAMEBrance, Turner   [phone]  Walter, Turner    [phone]           Emotional Assessment Appearance:: Appears stated age   Affect (typically observed): Accepting, Appropriate Orientation: : Oriented to Self, Oriented to Place, Oriented to  Time, Oriented to Situation Alcohol / Substance Use: Alcohol Use Psych Involvement: No (comment)  Admission diagnosis:  DKA (diabetic ketoacidosis) (HCC) [E11.10] Diabetic ketoacidosis without coma associated with type 2 diabetes mellitus (HCC) [E11.10] Diabetic ketoacidosis associated with type 2 diabetes mellitus (HCC) [E11.10] Patient Active Problem List   Diagnosis Date Noted   DKA (diabetic ketoacidosis) (HCC) 05/25/2021   Diabetic ketoacidosis associated with type 2 diabetes mellitus (HCC) 05/24/2021   Alcohol use 05/24/2021   Tobacco use  05/24/2021   PCP:  Pcp, No Pharmacy:   [id number] [place], [place] - [address] [place] [address] Phone: [phone] Fax: [phone]     Social Determinants of Health (SDOH) Interventions    Readmission Risk Interventions No  flowsheet data found.

## 2021-05-27 ENCOUNTER — Other Ambulatory Visit (HOSPITAL_COMMUNITY): Payer: Self-pay

## 2021-05-27 LAB — HEMOGLOBIN A1C
Hgb A1c MFr Bld: 11.4 % — ABNORMAL HIGH (ref 4.8–5.6)
Mean Plasma Glucose: 280 mg/dL

## 2024-01-24 ENCOUNTER — Emergency Department (HOSPITAL_COMMUNITY)

## 2024-01-24 ENCOUNTER — Other Ambulatory Visit: Payer: Self-pay

## 2024-01-24 ENCOUNTER — Emergency Department (HOSPITAL_COMMUNITY)
Admission: EM | Admit: 2024-01-24 | Discharge: 2024-01-25 | Disposition: A | Discharge status: Home / Self Care | Attending: Emergency Medicine | Admitting: Emergency Medicine

## 2024-01-24 DIAGNOSIS — S0101XA Laceration without foreign body of scalp, initial encounter: Secondary | ICD-10-CM | POA: Diagnosis not present

## 2024-01-24 DIAGNOSIS — E119 Type 2 diabetes mellitus without complications: Secondary | ICD-10-CM | POA: Diagnosis not present

## 2024-01-24 DIAGNOSIS — Z794 Long term (current) use of insulin: Secondary | ICD-10-CM | POA: Diagnosis not present

## 2024-01-24 DIAGNOSIS — R55 Syncope and collapse: Secondary | ICD-10-CM

## 2024-01-24 DIAGNOSIS — Z7984 Long term (current) use of oral hypoglycemic drugs: Secondary | ICD-10-CM | POA: Diagnosis not present

## 2024-01-24 DIAGNOSIS — I10 Essential (primary) hypertension: Secondary | ICD-10-CM | POA: Diagnosis not present

## 2024-01-24 DIAGNOSIS — T17908A Unspecified foreign body in respiratory tract, part unspecified causing other injury, initial encounter: Secondary | ICD-10-CM

## 2024-01-24 DIAGNOSIS — Y92007 Garden or yard of unspecified non-institutional (private) residence as the place of occurrence of the external cause: Secondary | ICD-10-CM | POA: Diagnosis not present

## 2024-01-24 DIAGNOSIS — G9340 Encephalopathy, unspecified: Secondary | ICD-10-CM | POA: Insufficient documentation

## 2024-01-24 DIAGNOSIS — K469 Unspecified abdominal hernia without obstruction or gangrene: Secondary | ICD-10-CM | POA: Insufficient documentation

## 2024-01-24 DIAGNOSIS — Z23 Encounter for immunization: Secondary | ICD-10-CM | POA: Insufficient documentation

## 2024-01-24 DIAGNOSIS — R569 Unspecified convulsions: Secondary | ICD-10-CM | POA: Diagnosis not present

## 2024-01-24 DIAGNOSIS — W19XXXA Unspecified fall, initial encounter: Secondary | ICD-10-CM | POA: Insufficient documentation

## 2024-01-24 DIAGNOSIS — F17200 Nicotine dependence, unspecified, uncomplicated: Secondary | ICD-10-CM | POA: Diagnosis not present

## 2024-01-24 DIAGNOSIS — S0990XA Unspecified injury of head, initial encounter: Secondary | ICD-10-CM | POA: Diagnosis present

## 2024-01-24 DIAGNOSIS — S060X9A Concussion with loss of consciousness of unspecified duration, initial encounter: Secondary | ICD-10-CM | POA: Diagnosis not present

## 2024-01-24 LAB — SAMPLE TO BLOOD BANK

## 2024-01-24 LAB — CBC
HCT: 54 % — ABNORMAL HIGH (ref 39.0–52.0)
Hemoglobin: 17.8 g/dL — ABNORMAL HIGH (ref 13.0–17.0)
MCH: 32.2 pg (ref 26.0–34.0)
MCHC: 33 g/dL (ref 30.0–36.0)
MCV: 97.8 fL (ref 80.0–100.0)
Platelets: 194 K/uL (ref 150–400)
RBC: 5.52 MIL/uL (ref 4.22–5.81)
RDW: 13.1 % (ref 11.5–15.5)
WBC: 12.8 K/uL — ABNORMAL HIGH (ref 4.0–10.5)
nRBC: 0 % (ref 0.0–0.2)

## 2024-01-24 LAB — I-STAT VENOUS BLOOD GAS, ED
Acid-base deficit: 4 mmol/L — ABNORMAL HIGH (ref 0.0–2.0)
Bicarbonate: 23.1 mmol/L (ref 20.0–28.0)
Calcium, Ion: 1.09 mmol/L — ABNORMAL LOW (ref 1.15–1.40)
HCT: 54 % — ABNORMAL HIGH (ref 39.0–52.0)
Hemoglobin: 18.4 g/dL — ABNORMAL HIGH (ref 13.0–17.0)
O2 Saturation: 95 %
Potassium: 4.3 mmol/L (ref 3.5–5.1)
Sodium: 139 mmol/L (ref 135–145)
TCO2: 24 mmol/L (ref 22–32)
pCO2, Ven: 46.8 mmHg (ref 44–60)
pH, Ven: 7.301 (ref 7.25–7.43)
pO2, Ven: 87 mmHg — ABNORMAL HIGH (ref 32–45)

## 2024-01-24 LAB — I-STAT CHEM 8, ED
BUN: 20 mg/dL (ref 6–20)
Calcium, Ion: 1.14 mmol/L — ABNORMAL LOW (ref 1.15–1.40)
Chloride: 104 mmol/L (ref 98–111)
Creatinine, Ser: 1.3 mg/dL — ABNORMAL HIGH (ref 0.61–1.24)
Glucose, Bld: 162 mg/dL — ABNORMAL HIGH (ref 70–99)
HCT: 55 % — ABNORMAL HIGH (ref 39.0–52.0)
Hemoglobin: 18.7 g/dL — ABNORMAL HIGH (ref 13.0–17.0)
Potassium: 3.4 mmol/L — ABNORMAL LOW (ref 3.5–5.1)
Sodium: 140 mmol/L (ref 135–145)
TCO2: 22 mmol/L (ref 22–32)

## 2024-01-24 LAB — CK: Total CK: 327 U/L (ref 49–397)

## 2024-01-24 LAB — PROTIME-INR
INR: 0.9 (ref 0.8–1.2)
Prothrombin Time: 12.8 s (ref 11.4–15.2)

## 2024-01-24 LAB — COMPREHENSIVE METABOLIC PANEL WITH GFR
ALT: 37 U/L (ref 0–44)
AST: 32 U/L (ref 15–41)
Albumin: 4.1 g/dL (ref 3.5–5.0)
Alkaline Phosphatase: 85 U/L (ref 38–126)
Anion gap: 16 — ABNORMAL HIGH (ref 5–15)
BUN: 17 mg/dL (ref 6–20)
CO2: 19 mmol/L — ABNORMAL LOW (ref 22–32)
Calcium: 9.4 mg/dL (ref 8.9–10.3)
Chloride: 104 mmol/L (ref 98–111)
Creatinine, Ser: 1.32 mg/dL — ABNORMAL HIGH (ref 0.61–1.24)
GFR, Estimated: >60 mL/min (ref >60)
Glucose, Bld: 162 mg/dL — ABNORMAL HIGH (ref 70–99)
Potassium: 3.5 mmol/L (ref 3.5–5.1)
Sodium: 139 mmol/L (ref 135–145)
Total Bilirubin: 1 mg/dL (ref 0.0–1.2)
Total Protein: 7.3 g/dL (ref 6.5–8.1)

## 2024-01-24 LAB — ETHANOL: Alcohol, Ethyl (B): 34 mg/dL — ABNORMAL HIGH (ref <15)

## 2024-01-24 LAB — I-STAT CG4 LACTIC ACID, ED
Lactic Acid, Venous: 1.1 mmol/L (ref 0.5–1.9)
Lactic Acid, Venous: 5 mmol/L (ref 0.5–1.9)

## 2024-01-24 MED ORDER — LACTATED RINGERS IV BOLUS
1000.0000 mL | Freq: Once | INTRAVENOUS | Status: AC
  Administered 2024-01-24: 1000 mL via INTRAVENOUS

## 2024-01-24 MED ORDER — TETANUS-DIPHTH-ACELL PERTUSSIS 5-2.5-18.5 LF-MCG/0.5 IM SUSY
0.5000 mL | PREFILLED_SYRINGE | Freq: Once | INTRAMUSCULAR | Status: AC
  Administered 2024-01-24: 0.5 mL via INTRAMUSCULAR
  Filled 2024-01-24: qty 0.5

## 2024-01-24 MED ORDER — IOHEXOL 350 MG/ML SOLN
75.0000 mL | Freq: Once | INTRAVENOUS | Status: AC | PRN
  Administered 2024-01-24: 75 mL via INTRAVENOUS

## 2024-01-24 MED ORDER — IOHEXOL 350 MG/ML SOLN
100.0000 mL | Freq: Once | INTRAVENOUS | Status: AC | PRN
  Administered 2024-01-24: 100 mL via INTRAVENOUS

## 2024-01-24 NOTE — ED Triage Notes (Signed)
 Pt here from home found down in yard after a fall , last seen at 1 pm , pt arrived to the ED on a NRB with snoring resp ,

## 2024-01-24 NOTE — ED Provider Notes (Signed)
  Physical Exam  BP 128/75   Pulse 93   Temp 98.6 F (37 C) (Oral)   Resp (!) 21   Ht 6' (1.829 m)   Wt 104 kg   SpO2 96%   BMI 31.10 kg/m   Physical Exam  Procedures  Procedures  ED Course / MDM   Clinical Course as of 01/25/24 0007  Sun Jan 24, 2024  2150 Assumed care from Dr Charlyn. 58 yo M with hx of etoh use, dm, and htn who came in as a trauma activation bc AMS after a fall with LKW 12:45 who presented unresponsive and altered with a gash near his scalp that was repaired with staple. Trauma scans are negative but is very somnolent. Ethanol is 34. Still somnolent but will talk to you. Is encephalopathic still. Neuro seeing to eval for stroke. Will likley be admitted. Getting CT perfusion at this time.  [RP]  2315 Patient reassessed.  Wife is at the bedside.  He is alert and oriented x 3.  Wife says that he is back to his baseline at this point in time.  No focal neurologic deficits.  Satting well on room air. He requesting to go home. Neuro has reviewed imaging and basilar artery is patent. Low concern for stroke. Does feel that he should fu with neuro as an outpatient.  [RP]  Mon Jan 25, 2024  0000 Repeat lactic acid WNL.  Discussed the case with the patient and his wife and they would like to go home at this point in time.  Says that he was drinking and that he fell twice and that they had not heard from him in an hour but it does not appear that he was actually fully unconscious for an hour.  Low concern for seizures.  Suspect that he passed out from drinking and potentially the heat.  Will have him follow-up as an outpatient with his primary doctor and neurology.  Will have him follow-up with GI for his EGD and to visualize the GE junction.  Will also give him antibiotics for the debris in his bronchus.  Return precautions discussed prior to discharge. [RP]    Clinical Course User Index [RP] Yolande Lamar BROCKS, MD   Medical Decision Making Amount and/or Complexity of Data  Reviewed Labs: ordered. Radiology: ordered.  Risk Prescription drug management. Decision regarding hospitalization.       Yolande Lamar BROCKS, MD 01/25/24 [id number]

## 2024-01-24 NOTE — ED Notes (Addendum)
 Trauma Response Nurse Documentation   Walter Turner is a 58 y.o. male arriving to [facility] ED via EMS  On No antithrombotic. Trauma was activated as a Level 2 by ED Charge RN based on the following trauma criteria GCS 10-14 associated with trauma or AVPU < A. Pt arrived outside of room 7, primary RN and TRN assessed pt prior to placing pt in room and decided to upgrade due to pt's mental status and decline in GCS as well as snoring respirations.  GCS briefly an 8.  Pt was then rushed to Trauma A and Upgraded to a Level 1 trauma.  Shortly thereafter, pt woke up and mumbled some words and followed commands.  GCS now 13. Pt then downgraded back to a level 2 trauma.  Patient cleared for CT by Dr. Nanivati. Pt transported to CT with trauma response nurse present to monitor. RN remained with the patient throughout their absence from the department for clinical observation.   GCS 13.  History   No past medical history on file.   No past surgical history on file.   Initial Focused Assessment (If applicable, or please see trauma documentation): Airway: Initially snoring respirations heard and pt unresponsive however pt was aroused and able to [facility] and speak.  Speech somewhat garbled but per report, pt had been drinking alcohol.  Breathing: Breath sounds auscultated bilaterally. No CP or SOB.  Pt on 100% NRB due to desaturation of 84% on RA.   Circulation: Approx 6 cm laceration to posterior occiput; bleeding consistently and blood throughout hair and onto the bedsheet.  Bandaged head upon arrival and bleeding controlled. Manual BP slightly elevated; pt tachycardic. Pulses intact throughout. 18G PIV to L AC  Disability: Pt's GCS inconsistent but reports that pt has been drinking heavily.  GCS currently 13-14 and able to [facility] and answer questions with minimal confusion.  PERRLA; 2's sluggish. EMS C-collar in place. MAE equally.  CT's Completed:   CT Head, CT C-Spine, CT Chest w/ contrast, and CT  abdomen/pelvis w/ contrast   Interventions:  *18G PIV to R AC *1L warmed NS given *Pt undressed and assessed completely  *Replaced EMS field collar with [place].  *Pt logrolled while maintaining c-spine precautions: No stepoffs, deformities or injury noted to back.  *CXR *Maintained 100% NRB at 15L.  *CT pan scan.  *tdap  Plan for disposition:  Other   Consults completed:  none at 1945.  Event Summary: Pt was BIB [facility] after neighbors/bystanders say that pt was lying in the yard for quite some time.  Pt was last seen around 1pm today therefore it is unclear as to how long he was lying in the yard. Pt has inconsistent GCS that fluctuated with EMS and upon arrival to ED, therefore we upgraded him to a L1 trauma and then downgraded back to L2 due to pt's responsiveness.  Dr Paola did respond via phone and was on her way down but TRN notified her that the patient is hemodynamically stable at this time and the EDP is in the room.  Pt did sustain a 6cm laceration to the back of his head.  Pt is a poor historian and is unable to attest to how much or what he has drank or done today.   Bedside handoff with ED RN Annitta and Bernardino.    Walter Turner  Trauma Response RN  Please call TRN at [phone] for further assistance.

## 2024-01-24 NOTE — ED Provider Notes (Signed)
 [place] EMERGENCY DEPARTMENT AT [facility] Provider Note   CSN: [id number] Arrival date & time: 01/24/24  8171     Patient presents with: Trauma   Prince Couey is a 58 y.o. male.   HPI    58 year old male with history of tobacco use disorder, diabetes, hypertension comes in with chief complaint of head trauma and altered mental status.  Patient arrives as a level 2 trauma because his GCS is 13. According to EMS, patient was last seen normal at 1 PM.  He was found in his yard, under a tree.  Patient had a durag on, which was bloody and was found by the curb.  Patient was found few steps away under a tree.  Patient not responsive.  There was alcohol around him.  Patient allegedly admitted to drinking 4-6 beers today.  The neighbors called wife, who then called EMS.  Patient has been minimally responsive and very sleepy. He has a significant gash in the back of his head.  O2 sats were noted to be in the 80s, he has been placed on oxygen.  Prior to Admission medications   Medication Sig Start Date End Date Taking? Authorizing Provider  acetaminophen  (TYLENOL ) 500 MG tablet Take 500-1,000 mg by mouth every 6 (six) hours as needed for mild pain or headache.    [provider]  Blood Glucose Monitoring Suppl (BLOOD GLUCOSE MONITOR SYSTEM) w/Device KIT Use up to 4 times daily as directed 05/26/21   Antoinette Doe, MD  ibuprofen  (ADVIL ) 200 MG tablet Take 200-400 mg by mouth every 6 (six) hours as needed for headache or mild pain.    [provider]  insulin  detemir (LEVEMIR ) 100 UNIT/ML FlexPen Inject 40 Units into the skin at bedtime. 05/26/21   Antoinette Doe, MD  Insulin  Pen Needle 31G X 8 MM MISC Use as directed 05/26/21   Antoinette Doe, MD  metFORMIN  (GLUCOPHAGE ) 500 MG tablet Take 1 tablet (500 mg total) by mouth 2 (two) times daily with a meal. 05/26/21   Antoinette Doe, MD  multivitamin (ONE-A-DAY MEN'S) TABS tablet Take 1 tablet by  mouth daily with breakfast.    [provider]  vitamin C (ASCORBIC ACID) 500 MG tablet Take 500-1,000 mg by mouth daily.    [provider]    Allergies: Patient has no known allergies.    Review of Systems  Updated Vital Signs BP 125/60   Pulse (!) 101   Temp (!) 97.4 F (36.3 C) (Temporal)   Resp 13   Ht 6' (1.829 m)   Wt 104 kg   SpO2 99%   BMI 31.10 kg/m   Physical Exam Vitals and nursing note reviewed.  Constitutional:      Appearance: He is well-developed.     Comments: Somnolent, arousable initially with sternal rub, but thereafter response to verbal stimuli  HENT:     Head:     Comments: 8 cm, gaping laceration over the crown Eyes:     Comments: 2 mm, equal, sluggishly responsive  Neck:     Comments: In a c-collar Cardiovascular:     Rate and Rhythm: Tachycardia present.  Pulmonary:     Effort: Pulmonary effort is normal.  Abdominal:     Tenderness: There is no abdominal tenderness.     Hernia: A hernia is present.  Skin:    General: Skin is warm.  Neurological:     Mental Status: He is disoriented.     Comments: Moving all 4  extremities to noxious stimuli     (all labs ordered are listed, but only abnormal results are displayed) Labs Reviewed  COMPREHENSIVE METABOLIC PANEL WITH GFR - Abnormal; Notable for the following components:      Result Value   CO2 19 (*)    Glucose, Bld 162 (*)    Creatinine, Ser 1.32 (*)    Anion gap 16 (*)    All other components within normal limits  CBC - Abnormal; Notable for the following components:   WBC 12.8 (*)    Hemoglobin 17.8 (*)    HCT 54.0 (*)    All other components within normal limits  ETHANOL - Abnormal; Notable for the following components:   Alcohol, Ethyl (B) 34 (*)    All other components within normal limits  I-STAT CHEM 8, ED - Abnormal; Notable for the following components:   Potassium 3.4 (*)    Creatinine, Ser 1.30 (*)    Glucose, Bld 162 (*)    Calcium, Ion 1.14 (*)     Hemoglobin 18.7 (*)    HCT 55.0 (*)    All other components within normal limits  I-STAT CG4 LACTIC ACID, ED - Abnormal; Notable for the following components:   Lactic Acid, Venous 5.0 (*)    All other components within normal limits  I-STAT VENOUS BLOOD GAS, ED - Abnormal; Notable for the following components:   pO2, Ven 87 (*)    Acid-base deficit 4.0 (*)    Calcium, Ion 1.09 (*)    HCT 54.0 (*)    Hemoglobin 18.4 (*)    All other components within normal limits  PROTIME-INR  CK  URINALYSIS, ROUTINE W REFLEX MICROSCOPIC  SAMPLE TO BLOOD BANK    EKG: EKG Interpretation Date/Time:  Sunday January 24 2024 18:45:20 EDT Ventricular Rate:  122 PR Interval:  147 QRS Duration:  91 QT Interval:  328 QTC Calculation: 468 R Axis:   -80  Text Interpretation: Sinus tachycardia Multiform ventricular premature complexes Probable left atrial enlargement Inferior infarct, old No old tracing to compare Confirmed by Charlyn Sora [id number]) on 01/24/2024 8:50:11 PM  Radiology: CT HEAD WO CONTRAST Result Date: 01/24/2024 CLINICAL DATA:  Head trauma, moderate-severe; Polytrauma, blunt EXAM: CT HEAD WITHOUT CONTRAST CT CERVICAL SPINE WITHOUT CONTRAST TECHNIQUE: Multidetector CT imaging of the head and cervical spine was performed following the standard protocol without intravenous contrast. Multiplanar CT image reconstructions of the cervical spine were also generated. RADIATION DOSE REDUCTION: This exam was performed according to the departmental dose-optimization program which includes automated exposure control, adjustment of the mA and/or kV according to patient size and/or use of iterative reconstruction technique. COMPARISON:  None Available. FINDINGS: CT HEAD FINDINGS Brain: No evidence of large-territorial acute infarction. No parenchymal hemorrhage. No mass lesion. No extra-axial collection. No mass effect or midline shift. No hydrocephalus. Basilar cisterns are patent. Vascular: No hyperdense  vessel. Skull: No acute fracture or focal lesion. Sinuses/Orbits: Paranasal sinuses and mastoid air cells are clear. The orbits are unremarkable. Other: Right occipital scalp 1.7 cm hematoma. Overlying soft tissue laceration. Associated foci of gas within the soft tissues. No retained radiopaque foreign body. CT CERVICAL SPINE FINDINGS Alignment: Normal. Skull base and vertebrae: Multilevel severe degenerative changes of the spine. Associated multilevel severe osseous neural foraminal stenosis. No acute fracture. No aggressive appearing focal osseous lesion or focal pathologic process. Soft tissues and spinal canal: No prevertebral fluid or swelling. No visible canal hematoma. Upper chest: Unremarkable. Other: Atherosclerotic plaque of the carotid arteries within  the neck. IMPRESSION: 1. No acute intracranial abnormality. 2. No acute displaced fracture or traumatic listhesis of the cervical spine. 3. Multilevel severe degenerative changes of the spine. Associated multilevel severe osseous neural foraminal stenosis. Recommend MRI cervical spine for further evaluation. Electronically Signed   By: Morgane  Naveau M.D.   On: 01/24/2024 19:44   CT CERVICAL SPINE WO CONTRAST Result Date: 01/24/2024 CLINICAL DATA:  Head trauma, moderate-severe; Polytrauma, blunt EXAM: CT HEAD WITHOUT CONTRAST CT CERVICAL SPINE WITHOUT CONTRAST TECHNIQUE: Multidetector CT imaging of the head and cervical spine was performed following the standard protocol without intravenous contrast. Multiplanar CT image reconstructions of the cervical spine were also generated. RADIATION DOSE REDUCTION: This exam was performed according to the departmental dose-optimization program which includes automated exposure control, adjustment of the mA and/or kV according to patient size and/or use of iterative reconstruction technique. COMPARISON:  None Available. FINDINGS: CT HEAD FINDINGS Brain: No evidence of large-territorial acute infarction. No  parenchymal hemorrhage. No mass lesion. No extra-axial collection. No mass effect or midline shift. No hydrocephalus. Basilar cisterns are patent. Vascular: No hyperdense vessel. Skull: No acute fracture or focal lesion. Sinuses/Orbits: Paranasal sinuses and mastoid air cells are clear. The orbits are unremarkable. Other: Right occipital scalp 1.7 cm hematoma. Overlying soft tissue laceration. Associated foci of gas within the soft tissues. No retained radiopaque foreign body. CT CERVICAL SPINE FINDINGS Alignment: Normal. Skull base and vertebrae: Multilevel severe degenerative changes of the spine. Associated multilevel severe osseous neural foraminal stenosis. No acute fracture. No aggressive appearing focal osseous lesion or focal pathologic process. Soft tissues and spinal canal: No prevertebral fluid or swelling. No visible canal hematoma. Upper chest: Unremarkable. Other: Atherosclerotic plaque of the carotid arteries within the neck. IMPRESSION: 1. No acute intracranial abnormality. 2. No acute displaced fracture or traumatic listhesis of the cervical spine. 3. Multilevel severe degenerative changes of the spine. Associated multilevel severe osseous neural foraminal stenosis. Recommend MRI cervical spine for further evaluation. Electronically Signed   By: Morgane  Naveau M.D.   On: 01/24/2024 19:44   CT CHEST ABDOMEN PELVIS W CONTRAST Result Date: 01/24/2024 CLINICAL DATA:  Polytrauma, blunt EXAM: CT CHEST, ABDOMEN, AND PELVIS WITH CONTRAST TECHNIQUE: Multidetector CT imaging of the chest, abdomen and pelvis was performed following the standard protocol during bolus administration of intravenous contrast. RADIATION DOSE REDUCTION: This exam was performed according to the departmental dose-optimization program which includes automated exposure control, adjustment of the mA and/or kV according to patient size and/or use of iterative reconstruction technique. CONTRAST:  75mL OMNIPAQUE  IOHEXOL  350 MG/ML SOLN  COMPARISON:  January 24, 2024 chest radiograph FINDINGS: CT CHEST FINDINGS Pulmonary Embolism: While the exam was not optimized for the evaluation of the pulmonary arteries, no central pulmonary embolism visualized. Cardiovascular: No cardiomegaly or pericardial effusion.No aortic aneurysm. Mediastinum/Nodes: No mediastinal mass.No mediastinal, hilar, or axillary lymphadenopathy. Lungs/Pleura: Patent trachea. Moderate amount of debris or secretion in the bronchus intermedius extending into the right lower lobe bronchus. no focal airspace consolidation, pleural effusion, or pneumothorax. Posterior bilateral upper and lower lobe dependent atelectasis. CT ABDOMEN PELVIS FINDINGS Hepatobiliary: No mass.No radiopaque stones or wall thickening of the gallbladder. No intrahepatic or extrahepatic biliary ductal dilation. The portal veins are patent. Pancreas: No mass or main ductal dilation. No peripancreatic inflammation or fluid collection. Spleen: Normal size. No mass. Adrenals/Urinary Tract: No adrenal masses. No renal mass. No nephrolithiasis or hydronephrosis. The urinary bladder is distended without focal abnormality. Stomach/Bowel: The esophagus is dilated and fluid-filled to the thoracic inlet.  The stomach contains ingested material without focal abnormality. No small bowel wall thickening or inflammation. No small bowel obstruction.Normal appendix. Vascular/Lymphatic: No aortic aneurysm. Diffuse aortoiliac atherosclerosis. No intraabdominal or pelvic lymphadenopathy. Reproductive: No prostatomegaly.No free pelvic fluid. Other: No pneumoperitoneum, ascites, or mesenteric inflammation. Musculoskeletal: No acute fracture or destructive lesion. Postsurgical changes of the right iliac wing and right pubic bone. Healed posttraumatic deformity of the right inferior pubic ramus also noted. 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 IMPRESSION: 1. No acute, traumatic injury within the chest, abdomen,  and pelvis. 2. Moderate amount of debris or secretion in the bronchus intermedius extending into the right lower lobe bronchus. Dependent atelectasis noted within both upper and lower lobes. 3. The esophagus is dilated and fluid-filled to the level of the thoracic inlet, which places the patient at risk for aspiration. Nonemergent upper endoscopy is recommended to exclude an obstructive mass at the GE junction. Aortic Atherosclerosis (ICD10-I70.0). Electronically Signed   By: Rogelia Myers M.D.   On: 01/24/2024 19:39   DG Chest Port 1 View Result Date: 01/24/2024 CLINICAL DATA:  Trauma EXAM: PORTABLE CHEST - 1 VIEW COMPARISON:  May 24, 2021 FINDINGS: Low lung volumes. Bilateral perihilar interstitial opacities. No focal airspace consolidation or pleural effusion. No cardiomegaly. No acute fracture or destructive lesion. Multilevel thoracic osteophytosis. IMPRESSION: Bilateral perihilar interstitial opacities, which may represent bronchovascular crowding due to low lung volumes. Alternatively, atypical/viral infection or interstitial edema could have this appearance, in the correct clinical context. Electronically Signed   By: Rogelia Myers M.D.   On: 01/24/2024 18:56     .Critical Care  Performed by: Charlyn Sora, MD Authorized by: Charlyn Sora, MD   Critical care provider statement:    Critical care time (minutes):  48   Critical care was necessary to treat or prevent imminent or life-threatening deterioration of the following conditions:  CNS failure or compromise   Critical care was time spent personally by me on the following activities:  Development of treatment plan with patient or surrogate, discussions with consultants, evaluation of patient's response to treatment, examination of patient, ordering and review of laboratory studies, ordering and review of radiographic studies, ordering and performing treatments and interventions, pulse oximetry, re-evaluation of patient's  condition, review of old charts and obtaining history from patient or surrogate .Laceration Repair  Date/Time: 01/24/2024 9:35 PM  Performed by: Charlyn Sora, MD Authorized by: Charlyn Sora, MD   Consent:    Consent obtained:  Verbal   Consent given by:  Patient and spouse   Risks discussed:  Infection, pain, need for additional repair and poor wound healing Universal protocol:    Procedure explained and questions answered to patient or proxy's satisfaction: yes     Immediately prior to procedure, a time out was called: yes     Patient identity confirmed:  Arm band Anesthesia:    Anesthesia method:  None Laceration details:    Location:  Scalp   Scalp location:  Crown   Length (cm):  9   Depth (mm):  5 Pre-procedure details:    Preparation:  Patient was prepped and draped in usual sterile fashion Exploration:    Limited defect created (wound extended): no     Hemostasis achieved with:  Direct pressure   Imaging outcome: foreign body not noted     Wound exploration: wound explored through full range of motion and entire depth of wound visualized     Contaminated: no   Treatment:    Debridement:  None   Undermining:  None   Scar revision: no   Skin repair:    Repair method:  Staples   Number of staples:  8 Approximation:    Approximation:  Close Repair type:    Repair type:  Simple Post-procedure details:    Dressing:  Non-adherent dressing   Procedure completion:  Tolerated well, no immediate complications    Medications Ordered in the ED  lactated ringers  bolus 1,000 mL (0 mLs Intravenous Stopped 01/24/24 1941)  iohexol  (OMNIPAQUE ) 350 MG/ML injection 75 mL (75 mLs Intravenous Contrast Given 01/24/24 1902)  Tdap (BOOSTRIX ) injection 0.5 mL (0.5 mLs Intramuscular Given 01/24/24 2021)  lactated ringers  bolus 1,000 mL (1,000 mLs Intravenous New Bag/Given 01/24/24 2123)                                    Medical Decision Making Amount and/or Complexity of Data  Reviewed Labs: ordered. Radiology: ordered.  Risk Prescription drug management.   58 year old male comes in with chief complaint of altered mental status.  He was found under a tree by neighbors.  Last known normal at 12:45 PM.  Patient admits to drinking today, and he had couple cans of alcohol.  Patient has a GCS of 13-14 during arrival.  He is confused.  He is unable to provide meaningful history.  Patient thinks he is at his house still.  On exam, he has a deep gash over the crown.  Pupils are sluggish, but equal.  Patient is moving all 4 extremities.  His O2 sats are in the 80s on room air, he has been placed on oxygen.  Abdominal exam is reassuring.  It is unclear what transpired.  Given this, plan was to get full trauma evaluation.  Reassessment: I spoke with patient's wife around 8 PM.  She states that patient had gone out to do some work in the yard.  Last seen normal by her at 1245.  She received a call from neighbors.  When she arrived to the house, there were some other areas that looked unkept.  She thinks that patient might have been stumbling prior to his fall.  Patient reassessed on 2 separate occasions prior to 9 PM.  At 9 PM, he is still sleepy and snoring.  However, he is more alert when aroused.  He states that he felt swimmy headed and remembers falling.  He does not quite recall what happened once he fell, until EMS arrived.  He denies any double vision, one-sided weakness, numbness, slurred speech.  At this time, I have consulted neurology and added CTA head and neck with perfusion.  It is unclear if patient had a stroke and fell.  Basilar insufficiency would be in the differential for that.  Other possibilities that he might have had a concussion and therefore has some amnesia and remains very sleepy.  Laceration has been repaired.  Neurology to see the patient.  Anticipate admission at this time.  I have independently interpreted patient's CT of the head, no  evidence of brain bleed.  I have reviewed patient's labs and CK is reassuring, ethanol level is normal.  He has lactic acidosis.  Fluids ordered.  Mild acidosis noted.   Final diagnoses:  Acute encephalopathy    ED Discharge Orders     None          Charlyn Sora, MD 01/24/24 2137

## 2024-01-25 DIAGNOSIS — S060X9A Concussion with loss of consciousness of unspecified duration, initial encounter: Secondary | ICD-10-CM | POA: Diagnosis not present

## 2024-01-25 DIAGNOSIS — R569 Unspecified convulsions: Secondary | ICD-10-CM | POA: Diagnosis not present

## 2024-01-25 DIAGNOSIS — R55 Syncope and collapse: Secondary | ICD-10-CM | POA: Diagnosis not present

## 2024-01-25 MED ORDER — AMOXICILLIN-POT CLAVULANATE 875-125 MG PO TABS: 1.0000 | ORAL_TABLET | Freq: Two times a day (BID) | ORAL | 0 refills | Status: AC

## 2024-01-25 NOTE — Consult Note (Signed)
 NEUROLOGY CONSULT NOTE   Date of service: January 25, 2024 Patient Name: Walter Turner MRN:  [id number] DOB:  1965-08-17 Chief Complaint: Syncope Requesting Provider: No att. providers found  History of Present Illness  Walter Turner is a 58 y.o. male with hx of DM and hypertension who presents with transient episode of unresponsiveness.  He was in his normal state of health earlier at which time he felt severely lightheaded and then fell down and hit his head.  He subsequently stood up again and then felt lightheaded once again and fell back down.  He did lose consciousness for period of time, and was found by his wife unconscious.   His wife thinks that he was out for at least an hour, though this was not clearly witnessed.  He was confused on arrival, but is since returned to baseline.  He has never had any episodes like this before, and has never had a seizure, episodes of unresponsiveness, staring spells, episodes of loss time, or any other episodes of concern.  Past History  No past medical history on file.  No past surgical history on file.  Family History: Family History  Problem Relation Age of Onset   Hypertension Mother    Diabetes Sister     Social History  reports that he has been smoking cigarettes. He has a 25 pack-year smoking history. He does not have any smokeless tobacco history on file. He reports current alcohol use. He reports current drug use. Drugs: Cocaine and Marijuana.  No Known Allergies  Medications  No current facility-administered medications for this encounter.  Current Outpatient Medications:    acetaminophen  (TYLENOL ) 500 MG tablet, Take 500-1,000 mg by mouth every 6 (six) hours as needed for mild pain or headache., Disp: , Rfl:    amoxicillin -clavulanate (AUGMENTIN ) 875-125 MG tablet, Take 1 tablet by mouth every 12 (twelve) hours., Disp: 14 tablet, Rfl: 0   Cholecalciferol 1.25 MG (50000 UT) capsule, Take 50,000 Units by mouth  once a week., Disp: , Rfl:    ibuprofen  (ADVIL ) 200 MG tablet, Take 200-400 mg by mouth every 6 (six) hours as needed for headache or mild pain., Disp: , Rfl:    losartan (COZAAR) 50 MG tablet, Take 50 mg by mouth daily., Disp: , Rfl:    metFORMIN  (GLUCOPHAGE ) 500 MG tablet, Take 1 tablet (500 mg total) by mouth 2 (two) times daily with a meal., Disp: 60 tablet, Rfl: 11   multivitamin (ONE-A-DAY MEN'S) TABS tablet, Take 1 tablet by mouth daily with breakfast., Disp: , Rfl:    tirzepatide (MOUNJARO) 5 MG/0.5ML Pen, Inject 5 mg into the skin once a week., Disp: , Rfl:    vitamin C (ASCORBIC ACID) 500 MG tablet, Take 1,000 mg by mouth daily., Disp: , Rfl:    Blood Glucose Monitoring Suppl (BLOOD GLUCOSE MONITOR SYSTEM) w/Device KIT, Use up to 4 times daily as directed, Disp: 1 kit, Rfl: 0   insulin  detemir (LEVEMIR ) 100 UNIT/ML FlexPen, Inject 40 Units into the skin at bedtime. (Patient not taking: Reported on 01/24/2024), Disp: 9 mL, Rfl: 0   Insulin  Pen Needle 31G X 8 MM MISC, Use as directed, Disp: 100 each, Rfl: 1  Vitals   Vitals:   01/24/24 2330 01/24/24 2345 01/25/24 0020 01/25/24 0025  BP:   (!) 144/68   Pulse: 87 93 (!) 48 83  Resp: 11 (!) 21 20   Temp:      TempSrc:      SpO2: 97% 96% 99%  98%  Weight:      Height:        Body mass index is 31.1 kg/m.   Physical Exam   Constitutional: Appears well-developed and well-nourished.   Neurologic Examination    Neuro: Mental Status: Patient is awake, alert, oriented to person, place, month, year, and situation. Patient is able to give a clear and coherent history. No signs of aphasia or neglect Cranial Nerves: II: Visual Fields are full. Pupils are equal, round, and reactive to light.   III,IV, VI: EOMI without ptosis or diploplia.  V: Facial sensation is symmetric to temperature VII: Facial movement is symmetric.  VIII: hearing is intact to voice X: Uvula elevates symmetrically XII: tongue is midline without atrophy or  fasciculations.  Motor: Tone is normal. Bulk is normal. 5/5 strength was present in all four extremities.  Sensory: Sensation is symmetric to light touch and temperature in the arms and legs. Cerebellar: FNF and HKS are intact bilaterally        Labs/Imaging/Neurodiagnostic studies   CBC:  Recent Labs  Lab 2024-02-07 1837 Feb 07, 2024 1849 February 07, 2024 1852  WBC 12.8*  --   --   HGB 17.8* 18.7* 18.4*  HCT 54.0* 55.0* 54.0*  MCV 97.8  --   --   PLT 194  --   --    Basic Metabolic Panel:  Lab Results  Component Value Date   NA 139 Feb 07, 2024   K 4.3 Feb 07, 2024   CO2 19 (L) Feb 07, 2024   GLUCOSE 162 (H) 07-Feb-2024   BUN 20 Feb 07, 2024   CREATININE 1.30 (H) 02/07/24   CALCIUM 9.4 Feb 07, 2024   GFRNONAA >60 02/07/24   Lipid Panel: No results found for: LDLCALC HgbA1c:  Lab Results  Component Value Date   HGBA1C 11.4 (H) 05/24/2021   Urine Drug Screen:     Component Value Date/Time   LABOPIA NONE DETECTED 05/24/2021 1947   COCAINSCRNUR NONE DETECTED 05/24/2021 1947   LABBENZ NONE DETECTED 05/24/2021 1947   AMPHETMU NONE DETECTED 05/24/2021 1947   THCU NONE DETECTED 05/24/2021 1947   LABBARB NONE DETECTED 05/24/2021 1947    Alcohol Level     Component Value Date/Time   ETH 34 (H) 02/07/24 1837   INR  Lab Results  Component Value Date   INR 0.9 02/07/24    CT Head without contrast(Personally reviewed): Negative  CT angio Head and Neck with contrast(Personally reviewed): Negative  ASSESSMENT   Walter Turner is a 58 y.o. male with an episode of passing out that by description sounds mostly consistent with cardiac syncope.  My suspicion is that he syncopized and gave himself a concussion, unclear exactly how long he was down for.  I do think he would benefit from a syncope workup, would consider cardiac monitoring and echocardiogram.  Also an EEG would not be unreasonable given how long he was down for.  My suspicion for seizures is very low,  however.  RECOMMENDATIONS  Telemetry, echo EEG Could consider performing these as an outpatient if the patient does not want to be admitted ______________________________________________________________________    Signed, Aisha Seals, MD [facility]

## 2024-01-25 NOTE — Discharge Instructions (Addendum)
 You were seen for your loss of consciousness in the emergency department.   At home, please take the antibiotics because it appears that you may have choked on something when you lost consciousness.    Check your MyChart online for the results of any tests that had not resulted by the time you left the emergency department.   Follow-up with your primary doctor in 2-3 days regarding your visit.  Please follow-up with gastroenterology to schedule endoscopy to look for any masses in your stomach.  Follow-up with neurology and cardiology about your loss of consciousness.  Return immediately to the emergency department if you experience any of the following: Recurrent loss of consciousness, seizures, or any other concerning symptoms.    Thank you for visiting our Emergency Department. It was a pleasure taking care of you today.
